# Patient Record
Sex: Male | Born: 1955 | Race: White | Hispanic: No | Marital: Married | Smoking: Light tobacco smoker
Health system: Southern US, Community
[De-identification: ages and names within clinical notes are randomized; demographics above are authoritative.]

## PROBLEM LIST (undated history)

## (undated) DIAGNOSIS — M4800 Spinal stenosis, site unspecified: Secondary | ICD-10-CM

## (undated) HISTORY — DX: Spinal stenosis, site unspecified: M48.00

## (undated) HISTORY — PX: LITHOTRIPSY: SUR834

---

## 2019-04-01 ENCOUNTER — Ambulatory Visit (INDEPENDENT_AMBULATORY_CARE_PROVIDER_SITE_OTHER): Payer: Managed Care, Other (non HMO) | Admitting: Family Medicine

## 2019-04-01 ENCOUNTER — Encounter: Payer: Self-pay | Admitting: Family Medicine

## 2019-04-01 ENCOUNTER — Other Ambulatory Visit: Payer: Self-pay

## 2019-04-01 VITALS — BP 154/98 | HR 70 | Ht 70.0 in | Wt 184.0 lb

## 2019-04-01 DIAGNOSIS — R252 Cramp and spasm: Secondary | ICD-10-CM

## 2019-04-01 DIAGNOSIS — I10 Essential (primary) hypertension: Secondary | ICD-10-CM | POA: Diagnosis not present

## 2019-04-01 DIAGNOSIS — Z13220 Encounter for screening for lipoid disorders: Secondary | ICD-10-CM

## 2019-04-01 NOTE — Patient Instructions (Addendum)
Great to meet you! We'll be in touch with lab results You can try increasing the arginine supplement  See me again 6 months or sooner if needed.    Managing Your Hypertension Hypertension is commonly called high blood pressure. This is when the force of your blood pressing against the walls of your arteries is too strong. Arteries are blood vessels that carry blood from your heart throughout your body. Hypertension forces the heart to work harder to pump blood, and may cause the arteries to become narrow or stiff. Having untreated or uncontrolled hypertension can cause heart attack, stroke, kidney disease, and other problems. What are blood pressure readings? A blood pressure reading consists of a higher number over a lower number. Ideally, your blood pressure should be below 120/80. The first ("top") number is called the systolic pressure. It is a measure of the pressure in your arteries as your heart beats. The second ("bottom") number is called the diastolic pressure. It is a measure of the pressure in your arteries as the heart relaxes. What does my blood pressure reading mean? Blood pressure is classified into four stages. Based on your blood pressure reading, your health care provider may use the following stages to determine what type of treatment you need, if any. Systolic pressure and diastolic pressure are measured in a unit called mm Hg. Normal  Systolic pressure: below 123456.  Diastolic pressure: below 80. Elevated  Systolic pressure: [id number].  Diastolic pressure: below 80. Hypertension stage 1  Systolic pressure: 0000000.  Diastolic pressure: [id number]. Hypertension stage 2  Systolic pressure: [id number] or above.  Diastolic pressure: 90 or above. What health risks are associated with hypertension? Managing your hypertension is an important responsibility. Uncontrolled hypertension can lead to:  A heart attack.  A stroke.  A weakened blood vessel (aneurysm).  Heart  failure.  Kidney damage.  Eye damage.  Metabolic syndrome.  Memory and concentration problems. What changes can I make to manage my hypertension? Hypertension can be managed by making lifestyle changes and possibly by taking medicines. Your health care provider will help you make a plan to bring your blood pressure within a normal range. Eating and drinking   Eat a diet that is high in fiber and potassium, and low in salt (sodium), added sugar, and fat. An example eating plan is called the DASH (Dietary Approaches to Stop Hypertension) diet. To eat this way: ? Eat plenty of fresh fruits and vegetables. Try to fill half of your plate at each meal with fruits and vegetables. ? Eat whole grains, such as whole wheat pasta, brown rice, or whole grain bread. Fill about one quarter of your plate with whole grains. ? Eat low-fat diary products. ? Avoid fatty cuts of meat, processed or cured meats, and poultry with skin. Fill about one quarter of your plate with lean proteins such as fish, chicken without skin, beans, eggs, and tofu. ? Avoid premade and processed foods. These tend to be higher in sodium, added sugar, and fat.  Reduce your daily sodium intake. Most people with hypertension should eat less than 1,500 mg of sodium a day.  Limit alcohol intake to no more than 1 drink a day for nonpregnant women and 2 drinks a day for men. One drink equals 12 oz of beer, 5 oz of wine, or 1 oz of hard liquor. Lifestyle  Work with your health care provider to maintain a healthy body weight, or to lose weight. Ask what an ideal weight is for you.  Get at least 30 minutes of exercise that causes your heart to beat faster (aerobic exercise) most days of the week. Activities may include walking, swimming, or biking.  Include exercise to strengthen your muscles (resistance exercise), such as weight lifting, as part of your weekly exercise routine. Try to do these types of exercises for 30 minutes at least  3 days a week.  Do not use any products that contain nicotine or tobacco, such as cigarettes and e-cigarettes. If you need help quitting, ask your health care provider.  Control any long-term (chronic) conditions you have, such as high cholesterol or diabetes. Monitoring  Monitor your blood pressure at home as told by your health care provider. Your personal target blood pressure may vary depending on your medical conditions, your age, and other factors.  Have your blood pressure checked regularly, as often as told by your health care provider. Working with your health care provider  Review all the medicines you take with your health care provider because there may be side effects or interactions.  Talk with your health care provider about your diet, exercise habits, and other lifestyle factors that may be contributing to hypertension.  Visit your health care provider regularly. Your health care provider can help you create and adjust your plan for managing hypertension. Will I need medicine to control my blood pressure? Your health care provider may prescribe medicine if lifestyle changes are not enough to get your blood pressure under control, and if:  Your systolic blood pressure is 130 or higher.  Your diastolic blood pressure is 80 or higher. Take medicines only as told by your health care provider. Follow the directions carefully. Blood pressure medicines must be taken as prescribed. The medicine does not work as well when you skip doses. Skipping doses also puts you at risk for problems. Contact a health care provider if:  You think you are having a reaction to medicines you have taken.  You have repeated (recurrent) headaches.  You feel dizzy.  You have swelling in your ankles.  You have trouble with your vision. Get help right away if:  You develop a severe headache or confusion.  You have unusual weakness or numbness, or you feel faint.  You have severe pain in your  chest or abdomen.  You vomit repeatedly.  You have trouble breathing. Summary  Hypertension is when the force of blood pumping through your arteries is too strong. If this condition is not controlled, it may put you at risk for serious complications.  Your personal target blood pressure may vary depending on your medical conditions, your age, and other factors. For most people, a normal blood pressure is less than 120/80.  Hypertension is managed by lifestyle changes, medicines, or both. Lifestyle changes include weight loss, eating a healthy, low-sodium diet, exercising more, and limiting alcohol. This information is not intended to replace advice given to you by your health care provider. Make sure you discuss any questions you have with your health care provider. Document Revised: 05/31/2018 Document Reviewed: 01/05/2016 Elsevier Patient Education  [place].

## 2019-04-01 NOTE — Progress Notes (Signed)
Brett Garner - 64 y.o. male MRN [id number]  Date of birth: Dec 05, 1955  Subjective Chief Complaint  Patient presents with  . Establish Care    HPI Brett Garner is a 64 y.o. male with a history of kidney stones and HTN here today for initial visit.  He reports some issues with cramping in his hands. He denies associated numbness or tingling.  He denies chronic neck pain or cramping in other extremities.  He has had some improvement with Mg supplement. He is taking a variety of supplements to help with blood pressure including L-Arginine.  He stays active, walking several minutes per day.  He feels that he follows a healthy diet.   ROS:  A comprehensive ROS was completed and negative except as noted per HPI  Allergies  Allergen Reactions  . Shellfish Allergy     History reviewed. No pertinent past medical history.  Past Surgical History:  Procedure Laterality Date  . LITHOTRIPSY Left     Social History   Socioeconomic History  . Marital status: Married    Spouse name: Not on file  . Number of children: Not on file  . Years of education: Not on file  . Highest education level: Not on file  Occupational History  . Not on file  Tobacco Use  . Smoking status: Light Tobacco Smoker    Types: Cigars  Substance and Sexual Activity  . Alcohol use: Yes  . Drug use: Never  . Sexual activity: Yes    Partners: Female    Birth control/protection: None  Other Topics Concern  . Not on file  Social History Narrative  . Not on file   Social Determinants of Health   Financial Resource Strain:   . Difficulty of Paying Living Expenses: Not on file  Food Insecurity:   . Worried About Charity fundraiser in the Last Year: Not on file  . Ran Out of Food in the Last Year: Not on file  Transportation Needs:   . Lack of Transportation (Medical): Not on file  . Lack of Transportation (Non-Medical): Not on file  Physical Activity:   . Days of Exercise per Week: Not on file  . Minutes of  Exercise per Session: Not on file  Stress:   . Feeling of Stress : Not on file  Social Connections:   . Frequency of Communication with Friends and Family: Not on file  . Frequency of Social Gatherings with Friends and Family: Not on file  . Attends Religious Services: Not on file  . Active Member of Clubs or Organizations: Not on file  . Attends Archivist Meetings: Not on file  . Marital Status: Not on file    Family History  Problem Relation Age of Onset  . Hypertension Mother   . Congestive Heart Failure Mother   . Alcoholism Father     Health Maintenance  Topic Date Due  . Hepatitis C Screening  Nov 18, 1955  . HIV Screening  03/08/1970  . COLONOSCOPY  03/08/2005  . TETANUS/TDAP  02/21/2015  . INFLUENZA VACCINE  Completed    ----------------------------------------------------------------------------------------------------------------------------------------------------------------------------------------------------------------- Physical Exam BP (!) 154/98   Pulse 70   Ht 5\' 10"  (1.778 m)   Wt 184 lb (83.5 kg)   BMI 26.40 kg/m   Physical Exam Constitutional:      Appearance: Normal appearance.  HENT:     Head: Normocephalic and atraumatic.     Mouth/Throat:     Mouth: Mucous membranes are moist.  Eyes:  General: No scleral icterus. Cardiovascular:     Rate and Rhythm: Normal rate and regular rhythm.  Pulmonary:     Effort: Pulmonary effort is normal.     Breath sounds: Normal breath sounds.  Musculoskeletal:     Cervical back: Neck supple.  Skin:    General: Skin is warm and dry.  Neurological:     General: No focal deficit present.     Mental Status: He is alert.     Comments: Negative phalen tests and carpal tunnel compression test.    Psychiatric:        Mood and Affect: Mood normal.        Behavior: Behavior normal.      ------------------------------------------------------------------------------------------------------------------------------------------------------------------------------------------------------------------- Assessment and Plan  Essential hypertension Blood pressure is not at goal at for age and co-morbidities.  He reports BP is typically elevated at doctor and has good BP readings at home.  He declines medications at this time and would prefer to continue on current supplements.  Discussed limited utility in controlling his blood pressure.  In addition they were instructed to follow a low sodium diet with regular exercise to help to maintain adequate control of blood pressure.    Cramping of hands History and exam do not seem consistent with CTS at this time.  Check electrolytes including magnesium.  If worsening or developing new symptoms we can consider nerve conduction.     This visit occurred during the SARS-CoV-2 public health emergency.  Safety protocols were in place, including screening questions prior to the visit, additional usage of staff PPE, and extensive cleaning of exam room while observing appropriate contact time as indicated for disinfecting solutions.

## 2019-04-02 LAB — LIPID PANEL
Cholesterol: 163 mg/dL (ref <200)
HDL: 39 mg/dL — ABNORMAL LOW (ref >=40)
LDL Cholesterol (Calc): 103 mg/dL (calc) — ABNORMAL HIGH
Non-HDL Cholesterol (Calc): 124 mg/dL (calc) (ref <130)
Total CHOL/HDL Ratio: 4.2 (calc) (ref <5.0)
Triglycerides: 116 mg/dL (ref <150)

## 2019-04-02 LAB — CBC
HCT: 40.3 % (ref 38.5–50.0)
Hemoglobin: 13.9 g/dL (ref 13.2–17.1)
MCH: 30.3 pg (ref 27.0–33.0)
MCHC: 34.5 g/dL (ref 32.0–36.0)
MCV: 88 fL (ref 80.0–100.0)
MPV: 10 fL (ref 7.5–12.5)
Platelets: 306 10*3/uL (ref 140–400)
RBC: 4.58 10*6/uL (ref 4.20–5.80)
RDW: 12.5 % (ref 11.0–15.0)
WBC: 5 10*3/uL (ref 3.8–10.8)

## 2019-04-02 LAB — COMPLETE METABOLIC PANEL WITH GFR
AG Ratio: 1.6 (calc) (ref 1.0–2.5)
ALT: 49 U/L — ABNORMAL HIGH (ref 9–46)
AST: 25 U/L (ref 10–35)
Albumin: 4.4 g/dL (ref 3.6–5.1)
Alkaline phosphatase (APISO): 45 U/L (ref 35–144)
BUN: 18 mg/dL (ref 7–25)
CO2: 29 mmol/L (ref 20–32)
Calcium: 9.1 mg/dL (ref 8.6–10.3)
Chloride: 104 mmol/L (ref 98–110)
Creat: 0.82 mg/dL (ref 0.70–1.25)
GFR, Est African American: 108 mL/min/{1.73_m2} (ref >=60)
GFR, Est Non African American: 93 mL/min/{1.73_m2} (ref >=60)
Globulin: 2.7 g/dL (calc) (ref 1.9–3.7)
Glucose, Bld: 95 mg/dL (ref 65–99)
Potassium: 5.3 mmol/L (ref 3.5–5.3)
Sodium: 140 mmol/L (ref 135–146)
Total Bilirubin: 0.8 mg/dL (ref 0.2–1.2)
Total Protein: 7.1 g/dL (ref 6.1–8.1)

## 2019-04-02 LAB — MAGNESIUM: Magnesium: 2.2 mg/dL (ref 1.5–2.5)

## 2019-04-02 LAB — TSH: TSH: 1.22 mIU/L (ref 0.40–4.50)

## 2019-04-06 ENCOUNTER — Encounter: Payer: Self-pay | Admitting: Family Medicine

## 2019-04-06 DIAGNOSIS — R252 Cramp and spasm: Secondary | ICD-10-CM | POA: Insufficient documentation

## 2019-04-06 DIAGNOSIS — I10 Essential (primary) hypertension: Secondary | ICD-10-CM | POA: Insufficient documentation

## 2019-04-06 NOTE — Assessment & Plan Note (Signed)
History and exam do not seem consistent with CTS at this time.  Check electrolytes including magnesium.  If worsening or developing new symptoms we can consider nerve conduction.

## 2019-04-06 NOTE — Assessment & Plan Note (Signed)
Blood pressure is not at goal at for age and co-morbidities.  He reports BP is typically elevated at doctor and has good BP readings at home.  He declines medications at this time and would prefer to continue on current supplements.  Discussed limited utility in controlling his blood pressure.  In addition they were instructed to follow a low sodium diet with regular exercise to help to maintain adequate control of blood pressure.

## 2019-05-01 ENCOUNTER — Telehealth: Payer: Self-pay

## 2019-05-01 DIAGNOSIS — Z8616 Personal history of COVID-19: Secondary | ICD-10-CM

## 2019-05-01 NOTE — Telephone Encounter (Signed)
Eliu called and left a message requesting a COVID-19 antibodies test. Please advise.

## 2019-05-01 NOTE — Telephone Encounter (Signed)
We can check antibodies but doesn't necessarily mean that there are sufficient antibodies to provide immunity, just whether he has been exposed to coronavirus previously.

## 2019-05-02 NOTE — Telephone Encounter (Signed)
Left message advising of recommendations and a return call.

## 2019-05-02 NOTE — Telephone Encounter (Signed)
I advised of recommendations. I also mentioned his insurance may choose to not pay for the service. He said "I am not concerned about that, I have enough cash". Order placed. Patient is aware and will go next week.

## 2019-05-02 NOTE — Telephone Encounter (Signed)
Thanks!  CM

## 2019-05-06 LAB — SAR COV2 SEROLOGY (COVID19)AB(IGG),IA: SARS CoV2 AB IGG: POSITIVE — AB

## 2019-10-06 ENCOUNTER — Encounter: Payer: Self-pay | Admitting: Family Medicine

## 2019-10-06 ENCOUNTER — Ambulatory Visit (INDEPENDENT_AMBULATORY_CARE_PROVIDER_SITE_OTHER): Payer: Managed Care, Other (non HMO) | Admitting: Family Medicine

## 2019-10-06 VITALS — BP 151/94 | HR 69 | Temp 98.0°F | Wt 175.1 lb

## 2019-10-06 DIAGNOSIS — U071 COVID-19: Secondary | ICD-10-CM | POA: Diagnosis not present

## 2019-10-06 DIAGNOSIS — I10 Essential (primary) hypertension: Secondary | ICD-10-CM

## 2019-10-06 MED ORDER — LOSARTAN POTASSIUM 25 MG PO TABS: 25.0000 mg | ORAL_TABLET | Freq: Every day | ORAL | 2 refills | Status: DC

## 2019-10-06 NOTE — Patient Instructions (Signed)
Great to see you today! Start losartan 25mg  daily See me again around the first of the year.

## 2019-10-06 NOTE — Assessment & Plan Note (Signed)
Previous COVID infection. Wishes to have antibodies rechecked.  Discussed that + antibody test does not mean he is immune and vaccination is still recommended.

## 2019-10-06 NOTE — Progress Notes (Signed)
Brett Garner - 64 y.o. male MRN [id number]  Date of birth: [birth year]  Subjective Chief Complaint  Patient presents with  . Hypertension    HPI Brett Garner is a 64 y.o. male with history of HTN here today for follow up visit.   He continues taking several supplements including magnesium and arginine to help with his BP.  He does admit to some increased stress at home.  He is willing to start medication at this point as he realizes he BP continues to be elevated.  He denies symptoms related to elevated BP including chest pain, shortness of breath, palpitations, headache or vision changes.    He is also insistent on having COVID antibody retest to see if this remains+.  He has not had vaccine.  ROS:  A comprehensive ROS was completed and negative except as noted per HPI  Allergies  Allergen Reactions  . Shellfish Allergy     No past medical history on file.  Past Surgical History:  Procedure Laterality Date  . LITHOTRIPSY Left     Social History   Socioeconomic History  . Marital status: Married    Spouse name: Not on file  . Number of children: Not on file  . Years of education: Not on file  . Highest education level: Not on file  Occupational History  . Not on file  Tobacco Use  . Smoking status: Light Tobacco Smoker    Types: Cigars  Vaping Use  . Vaping Use: Never used  Substance and Sexual Activity  . Alcohol use: Yes  . Drug use: Never  . Sexual activity: Yes    Partners: Female    Birth control/protection: None  Other Topics Concern  . Not on file  Social History Narrative  . Not on file   Social Determinants of Health   Financial Resource Strain:   . Difficulty of Paying Living Expenses:   Food Insecurity:   . Worried About Charity fundraiser in the Last Year:   . Arboriculturist in the Last Year:   Transportation Needs:   . Film/video editor (Medical):   Marland Kitchen Lack of Transportation (Non-Medical):   Physical Activity:   . Days of Exercise  per Week:   . Minutes of Exercise per Session:   Stress:   . Feeling of Stress :   Social Connections:   . Frequency of Communication with Friends and Family:   . Frequency of Social Gatherings with Friends and Family:   . Attends Religious Services:   . Active Member of Clubs or Organizations:   . Attends Archivist Meetings:   Marland Kitchen Marital Status:     Family History  Problem Relation Age of Onset  . Hypertension Mother   . Congestive Heart Failure Mother   . Alcoholism Father     Health Maintenance  Topic Date Due  . Hepatitis C Screening  Never done  . COVID-19 Vaccine (1) Never done  . HIV Screening  Never done  . COLONOSCOPY  Never done  . TETANUS/TDAP  02/21/2015  . INFLUENZA VACCINE  09/21/2019     ----------------------------------------------------------------------------------------------------------------------------------------------------------------------------------------------------------------- Physical Exam BP (!) 151/94 (BP Location: Left Arm, Patient Position: Sitting, Cuff Size: Normal)   Pulse 69   Temp 98 F (36.7 C) (Oral)   Wt 175 lb 1.3 oz (79.4 kg)   BMI 25.12 kg/m   Physical Exam Constitutional:      Appearance: Normal appearance.  HENT:     Head: Normocephalic and  atraumatic.  Eyes:     General: No scleral icterus. Cardiovascular:     Rate and Rhythm: Normal rate and regular rhythm.  Pulmonary:     Effort: Pulmonary effort is normal.     Breath sounds: Normal breath sounds.  Musculoskeletal:     Cervical back: Neck supple.  Neurological:     General: No focal deficit present.     Mental Status: He is alert.  Psychiatric:        Mood and Affect: Mood normal.        Behavior: Behavior normal.     ------------------------------------------------------------------------------------------------------------------------------------------------------------------------------------------------------------------- Assessment and  Plan  Essential hypertension BP remains elevated.  He did not tolerate atenolol or lisinopril previously.  He did ok with losartan and I will restart this at 25mg .  Encouraged low sodium diet and regular exercise.  Return in about 5 months (around 03/09/2020).    COVID-19 virus detected Previous COVID infection. Wishes to have antibodies rechecked.  Discussed that + antibody test does not mean he is immune and vaccination is still recommended.     Meds ordered this encounter  Medications  . losartan (COZAAR) 25 MG tablet    Sig: Take 1 tablet (25 mg total) by mouth daily.    Dispense:  90 tablet    Refill:  2    Return in about 5 months (around 03/09/2020).    This visit occurred during the SARS-CoV-2 public health emergency.  Safety protocols were in place, including screening questions prior to the visit, additional usage of staff PPE, and extensive cleaning of exam room while observing appropriate contact time as indicated for disinfecting solutions.

## 2019-10-06 NOTE — Assessment & Plan Note (Signed)
BP remains elevated.  He did not tolerate atenolol or lisinopril previously.  He did ok with losartan and I will restart this at 25mg .  Encouraged low sodium diet and regular exercise.  Return in about 5 months (around 03/09/2020).

## 2019-10-07 LAB — SARS-COV-2 ANTIBODY(IGG)SPIKE,SEMI-QUANTITATIVE: SARS COV1 AB(IGG)SPIKE,SEMI QN: 1.6 index — ABNORMAL HIGH (ref <1.00)

## 2020-03-09 ENCOUNTER — Ambulatory Visit: Payer: Managed Care, Other (non HMO) | Admitting: Family Medicine

## 2020-03-16 ENCOUNTER — Other Ambulatory Visit: Payer: Self-pay

## 2020-03-16 ENCOUNTER — Ambulatory Visit (INDEPENDENT_AMBULATORY_CARE_PROVIDER_SITE_OTHER): Payer: Medicare Other | Admitting: Family Medicine

## 2020-03-16 ENCOUNTER — Encounter: Payer: Self-pay | Admitting: Family Medicine

## 2020-03-16 VITALS — BP 133/81 | HR 68 | Wt 172.0 lb

## 2020-03-16 DIAGNOSIS — I10 Essential (primary) hypertension: Secondary | ICD-10-CM

## 2020-03-16 DIAGNOSIS — Z8616 Personal history of COVID-19: Secondary | ICD-10-CM | POA: Diagnosis not present

## 2020-03-16 DIAGNOSIS — Z13220 Encounter for screening for lipoid disorders: Secondary | ICD-10-CM | POA: Diagnosis not present

## 2020-03-16 MED ORDER — LOSARTAN POTASSIUM 25 MG PO TABS: 25.0000 mg | ORAL_TABLET | Freq: Every day | ORAL | 2 refills | Status: DC

## 2020-03-16 NOTE — Assessment & Plan Note (Signed)
Blood pressure is at goal at for age and co-morbidities.  I recommend continuation of losartan at current strength.  In addition they were instructed to follow a low sodium diet with regular exercise to help to maintain adequate control of blood pressure. Updated labs orderd.

## 2020-03-16 NOTE — Progress Notes (Signed)
Brett Garner - 66 y.o. male MRN [id number]  Date of birth: 1956/02/02  Subjective Chief Complaint  Patient presents with  . Hypertension    HPI Brett Garner is a 65 y.o. male here today for follow up of HTN.  Current management with losartan, doing well with this.  Tries to stay active and maintain a healthy weight.    He would also like to have COVID antibodies re-checked.  Due for colonoscopy, would like to discuss this further at next visit.    He has had flu vaccine, he will send information about this.   ROS:  A comprehensive ROS was completed and negative except as noted per HPI  Allergies  Allergen Reactions  . Shellfish Allergy     No past medical history on file.  Past Surgical History:  Procedure Laterality Date  . LITHOTRIPSY Left     Social History   Socioeconomic History  . Marital status: Married    Spouse name: Not on file  . Number of children: Not on file  . Years of education: Not on file  . Highest education level: Not on file  Occupational History  . Not on file  Tobacco Use  . Smoking status: Light Tobacco Smoker    Types: Cigars  . Smokeless tobacco: Never Used  Vaping Use  . Vaping Use: Never used  Substance and Sexual Activity  . Alcohol use: Yes  . Drug use: Never  . Sexual activity: Yes    Partners: Female    Birth control/protection: None  Other Topics Concern  . Not on file  Social History Narrative  . Not on file   Social Determinants of Health   Financial Resource Strain: Not on file  Food Insecurity: Not on file  Transportation Needs: Not on file  Physical Activity: Not on file  Stress: Not on file  Social Connections: Not on file    Family History  Problem Relation Age of Onset  . Hypertension Mother   . Congestive Heart Failure Mother   . Alcoholism Father     Health Maintenance  Topic Date Due  . Hepatitis C Screening  Never done  . COVID-19 Vaccine (1) Never done  . HIV Screening  Never done  .  COLONOSCOPY (Pts 45-36yrs Insurance coverage will need to be confirmed)  Never done  . TETANUS/TDAP  02/21/2015  . INFLUENZA VACCINE  09/21/2019  . PNA vac Low Risk Adult (1 of 2 - PCV13) 03/08/2020     ----------------------------------------------------------------------------------------------------------------------------------------------------------------------------------------------------------------- Physical Exam BP 133/81   Pulse 68   Wt 172 lb (78 kg)   SpO2 100%   BMI 24.68 kg/m   Physical Exam Constitutional:      Appearance: Normal appearance.  HENT:     Head: Normocephalic and atraumatic.  Eyes:     General: No scleral icterus. Cardiovascular:     Rate and Rhythm: Normal rate and regular rhythm.  Pulmonary:     Effort: Pulmonary effort is normal.     Breath sounds: Normal breath sounds.  Musculoskeletal:     Cervical back: Neck supple.  Neurological:     General: No focal deficit present.     Mental Status: He is alert.  Psychiatric:        Mood and Affect: Mood normal.        Behavior: Behavior normal.     ------------------------------------------------------------------------------------------------------------------------------------------------------------------------------------------------------------------- Assessment and Plan  History of COVID-19 He would like to check to see if he continues to have detectable antibodies, spike IGG  antibody test ordered.   Essential hypertension Blood pressure is at goal at for age and co-morbidities.  I recommend continuation of losartan at current strength.  In addition they were instructed to follow a low sodium diet with regular exercise to help to maintain adequate control of blood pressure. Updated labs orderd.      Meds ordered this encounter  Medications  . losartan (COZAAR) 25 MG tablet    Sig: Take 1 tablet (25 mg total) by mouth daily.    Dispense:  90 tablet    Refill:  2    Return in  about 6 months (around 09/13/2020) for HTN.    This visit occurred during the SARS-CoV-2 public health emergency.  Safety protocols were in place, including screening questions prior to the visit, additional usage of staff PPE, and extensive cleaning of exam room while observing appropriate contact time as indicated for disinfecting solutions.

## 2020-03-16 NOTE — Assessment & Plan Note (Signed)
He would like to check to see if he continues to have detectable antibodies, spike IGG antibody test ordered.

## 2020-03-16 NOTE — Patient Instructions (Signed)
Great to see you today! Continue losartan Have labs completed.  See me again in 6 months.

## 2020-03-17 LAB — COMPLETE METABOLIC PANEL WITH GFR
AG Ratio: 2.2 (calc) (ref 1.0–2.5)
ALT: 40 U/L (ref 9–46)
AST: 23 U/L (ref 10–35)
Albumin: 5.2 g/dL — ABNORMAL HIGH (ref 3.6–5.1)
Alkaline phosphatase (APISO): 54 U/L (ref 35–144)
BUN: 19 mg/dL (ref 7–25)
CO2: 28 mmol/L (ref 20–32)
Calcium: 9.7 mg/dL (ref 8.6–10.3)
Chloride: 103 mmol/L (ref 98–110)
Creat: 0.83 mg/dL (ref 0.70–1.25)
GFR, Est African American: 107 mL/min/{1.73_m2} (ref >=60)
GFR, Est Non African American: 92 mL/min/{1.73_m2} (ref >=60)
Globulin: 2.4 g/dL (calc) (ref 1.9–3.7)
Glucose, Bld: 91 mg/dL (ref 65–99)
Potassium: 4.6 mmol/L (ref 3.5–5.3)
Sodium: 140 mmol/L (ref 135–146)
Total Bilirubin: 0.7 mg/dL (ref 0.2–1.2)
Total Protein: 7.6 g/dL (ref 6.1–8.1)

## 2020-03-17 LAB — CBC
HCT: 46.1 % (ref 38.5–50.0)
Hemoglobin: 15.9 g/dL (ref 13.2–17.1)
MCH: 30.9 pg (ref 27.0–33.0)
MCHC: 34.5 g/dL (ref 32.0–36.0)
MCV: 89.5 fL (ref 80.0–100.0)
MPV: 10.5 fL (ref 7.5–12.5)
Platelets: 193 10*3/uL (ref 140–400)
RBC: 5.15 10*6/uL (ref 4.20–5.80)
RDW: 12.5 % (ref 11.0–15.0)
WBC: 3.9 10*3/uL (ref 3.8–10.8)

## 2020-03-17 LAB — LIPID PANEL
Cholesterol: 177 mg/dL (ref <200)
HDL: 56 mg/dL (ref >=40)
LDL Cholesterol (Calc): 105 mg/dL (calc) — ABNORMAL HIGH
Non-HDL Cholesterol (Calc): 121 mg/dL (calc) (ref <130)
Total CHOL/HDL Ratio: 3.2 (calc) (ref <5.0)
Triglycerides: 72 mg/dL (ref <150)

## 2020-03-17 LAB — SARS-COV-2 ANTIBODY(IGG)SPIKE,SEMI-QUANTITATIVE: SARS COV1 AB(IGG)SPIKE,SEMI QN: 1.51 index — ABNORMAL HIGH (ref <1.00)

## 2020-05-24 ENCOUNTER — Other Ambulatory Visit: Payer: Self-pay

## 2020-05-24 ENCOUNTER — Encounter: Payer: Self-pay | Admitting: Family Medicine

## 2020-05-24 ENCOUNTER — Ambulatory Visit (INDEPENDENT_AMBULATORY_CARE_PROVIDER_SITE_OTHER): Payer: Medicare Other | Admitting: Family Medicine

## 2020-05-24 DIAGNOSIS — D485 Neoplasm of uncertain behavior of skin: Secondary | ICD-10-CM | POA: Diagnosis not present

## 2020-05-24 NOTE — Progress Notes (Signed)
Brett Garner - 65 y.o. male MRN [id number]  Date of birth: December 28, 1955  Subjective No chief complaint on file.   HPI Brett Garner is a 65 y.o. male here today with concern of skin growth.  He noted a skin growth adjacent to his right eye a few weeks ago.  Area is rough in texture feels tight when he opens his eye completely.  He denies any bleeding or drainage in this area.  He requests a referral to dermatology given location of area.  He denies any prior history of skin cancer removal.  ROS:  A comprehensive ROS was completed and negative except as noted per HPI  Allergies  Allergen Reactions  . Shellfish Allergy     No past medical history on file.  Past Surgical History:  Procedure Laterality Date  . LITHOTRIPSY Left     Social History   Socioeconomic History  . Marital status: Married    Spouse name: Not on file  . Number of children: Not on file  . Years of education: Not on file  . Highest education level: Not on file  Occupational History  . Not on file  Tobacco Use  . Smoking status: Light Tobacco Smoker    Types: Cigars  . Smokeless tobacco: Never Used  Vaping Use  . Vaping Use: Never used  Substance and Sexual Activity  . Alcohol use: Yes  . Drug use: Never  . Sexual activity: Yes    Partners: Female    Birth control/protection: None  Other Topics Concern  . Not on file  Social History Narrative  . Not on file   Social Determinants of Health   Financial Resource Strain: Not on file  Food Insecurity: Not on file  Transportation Needs: Not on file  Physical Activity: Not on file  Stress: Not on file  Social Connections: Not on file    Family History  Problem Relation Age of Onset  . Hypertension Mother   . Congestive Heart Failure Mother   . Alcoholism Father     Health Maintenance  Topic Date Due  . Hepatitis C Screening  Never done  . COVID-19 Vaccine (1) Never done  . HIV Screening  Never done  . COLONOSCOPY (Pts 45-77yrs Insurance  coverage will need to be confirmed)  Never done  . TETANUS/TDAP  02/21/2015  . PNA vac Low Risk Adult (1 of 2 - PCV13) Never done  . INFLUENZA VACCINE  09/20/2020  . HPV VACCINES  Aged Out     ----------------------------------------------------------------------------------------------------------------------------------------------------------------------------------------------------------------- Physical Exam There were no vitals taken for this visit.  Physical Exam Constitutional:      Appearance: Normal appearance.  HENT:     Head: Normocephalic and atraumatic.  Skin:    Comments: Approximately 3 to 4 mm lesion between the right nasal bridge and medial canthus.  Slightly rough texture.  Neurological:     General: No focal deficit present.     Mental Status: He is alert.  Psychiatric:        Mood and Affect: Mood normal.        Behavior: Behavior normal.     ------------------------------------------------------------------------------------------------------------------------------------------------------------------------------------------------------------------- Assessment and Plan  Neoplasm of uncertain behavior of skin of eyebrow Possible BCC Requesting referral to dermatology, orders entered.    No orders of the defined types were placed in this encounter.   No follow-ups on file.    This visit occurred during the SARS-CoV-2 public health emergency.  Safety protocols were in place, including screening questions prior to  the visit, additional usage of staff PPE, and extensive cleaning of exam room while observing appropriate contact time as indicated for disinfecting solutions.

## 2020-05-24 NOTE — Assessment & Plan Note (Signed)
Possible BCC Requesting referral to dermatology, orders entered.

## 2020-05-27 ENCOUNTER — Encounter: Payer: Self-pay | Admitting: Family Medicine

## 2020-05-28 NOTE — Telephone Encounter (Signed)
I sent referral to [facility] they will call and schedule with patient - CF

## 2020-09-13 ENCOUNTER — Ambulatory Visit (INDEPENDENT_AMBULATORY_CARE_PROVIDER_SITE_OTHER): Payer: Medicare Other

## 2020-09-13 ENCOUNTER — Encounter: Payer: Self-pay | Admitting: Family Medicine

## 2020-09-13 ENCOUNTER — Other Ambulatory Visit: Payer: Self-pay

## 2020-09-13 ENCOUNTER — Ambulatory Visit (INDEPENDENT_AMBULATORY_CARE_PROVIDER_SITE_OTHER): Payer: Medicare Other | Admitting: Family Medicine

## 2020-09-13 VITALS — BP 138/74 | HR 67 | Temp 98.0°F | Ht 70.0 in | Wt 173.0 lb

## 2020-09-13 DIAGNOSIS — I10 Essential (primary) hypertension: Secondary | ICD-10-CM

## 2020-09-13 DIAGNOSIS — G8929 Other chronic pain: Secondary | ICD-10-CM

## 2020-09-13 DIAGNOSIS — M25531 Pain in right wrist: Secondary | ICD-10-CM | POA: Diagnosis not present

## 2020-09-13 DIAGNOSIS — S56911A Strain of unspecified muscles, fascia and tendons at forearm level, right arm, initial encounter: Secondary | ICD-10-CM | POA: Insufficient documentation

## 2020-09-13 IMAGING — DX DG WRIST COMPLETE 3+V*R*
4 series · 4 of 4 positions shown · non-contrast
Comparison: Non

CLINICAL DATA: Chronic RIGHT wrist pain. Pain on ulnar side of
wrist. History of prior fracture potentially by report.

EXAM:
RIGHT WRIST - COMPLETE 3+ VIEW

[wrist pa]
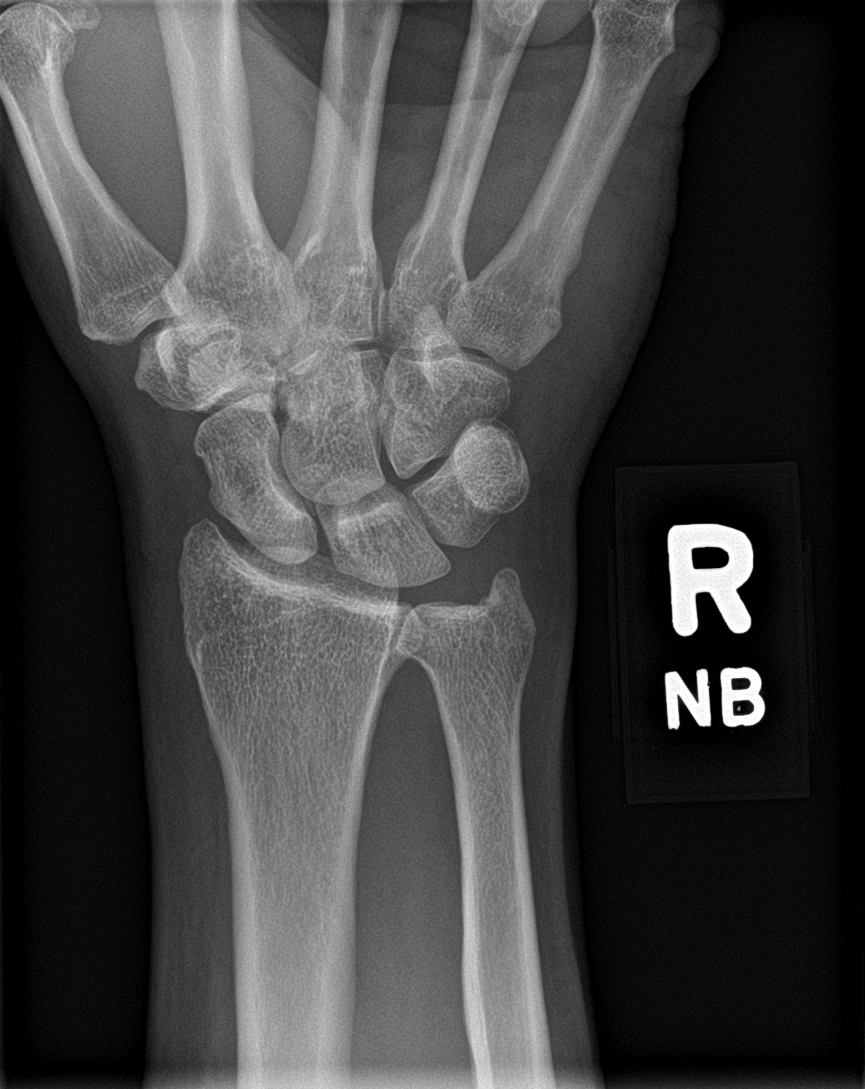

[wrist obl]
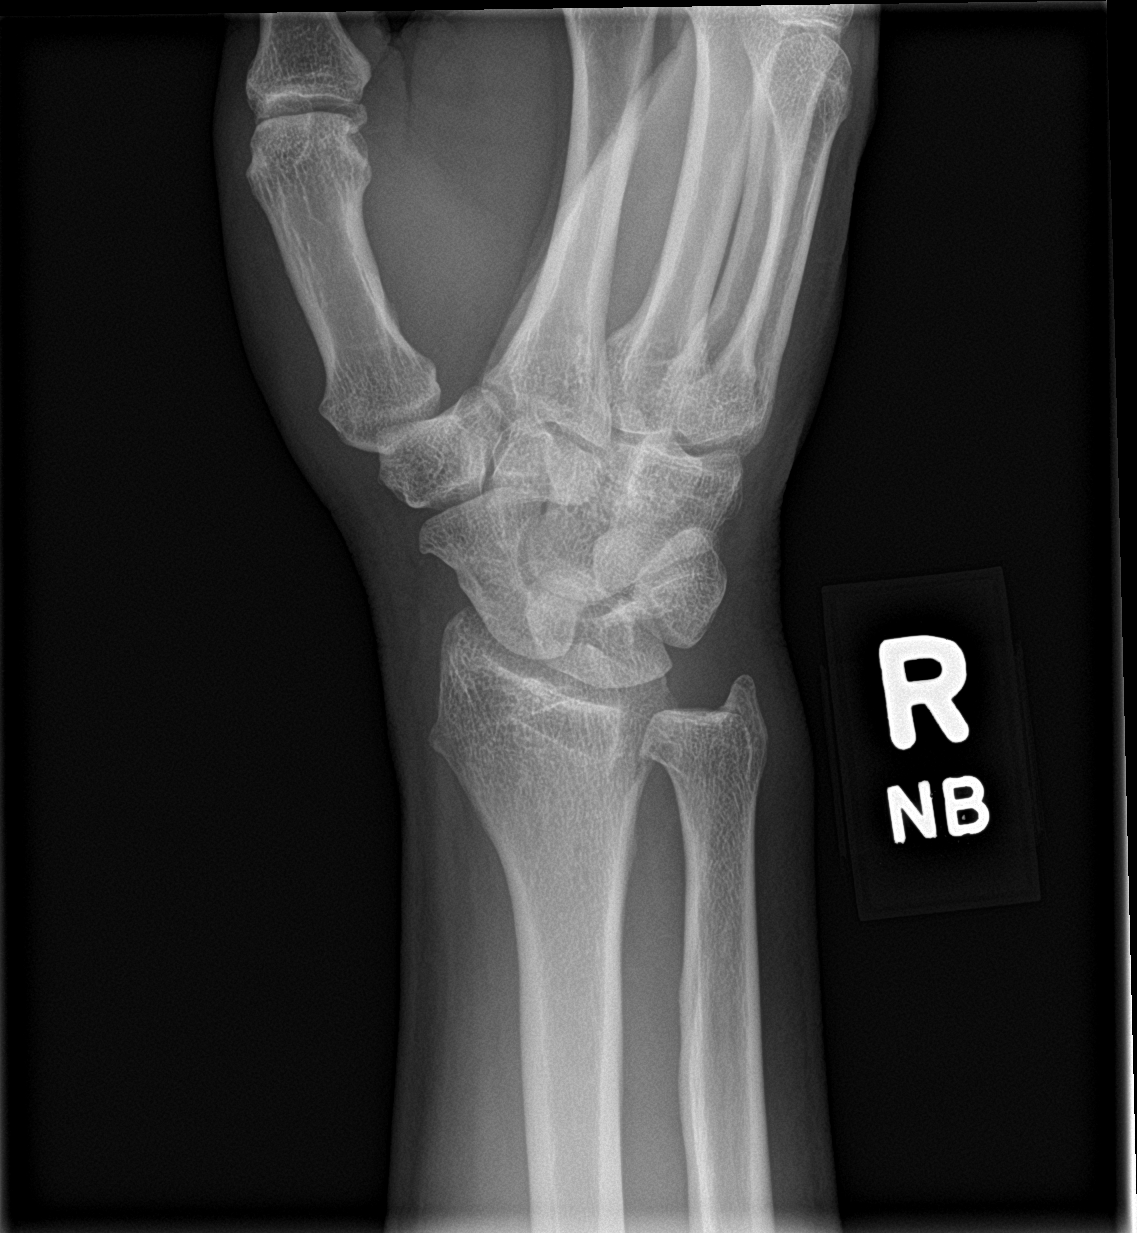

[wrist lat]
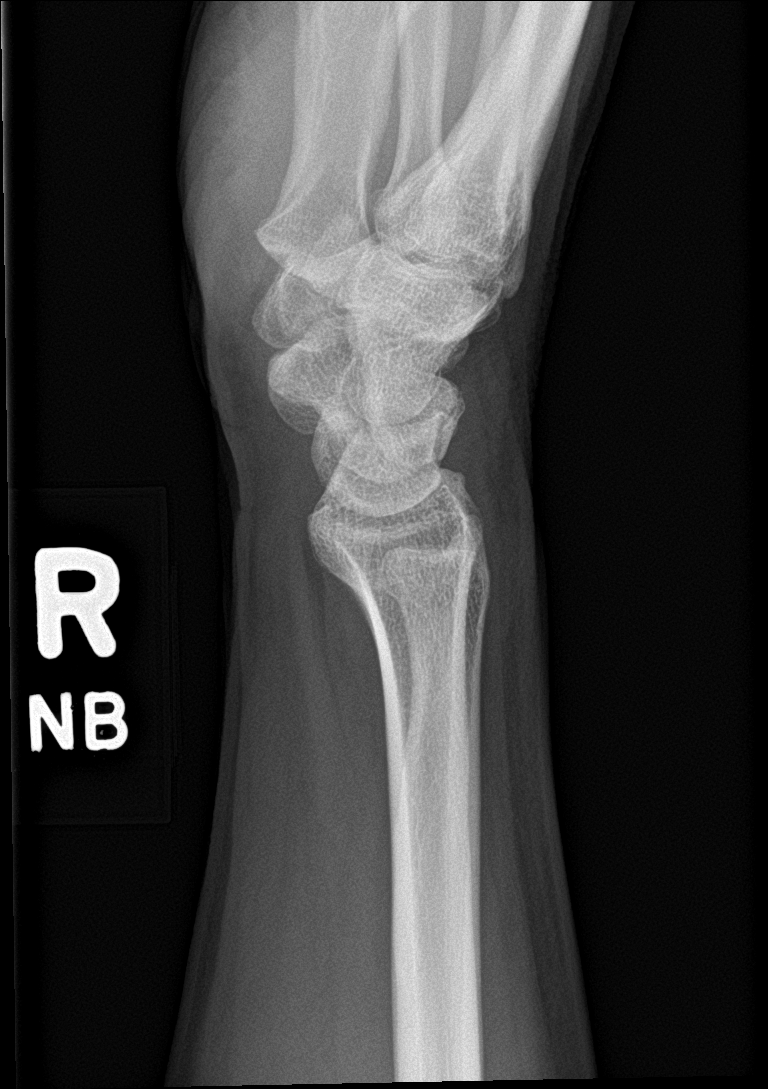

[wrist navicular]
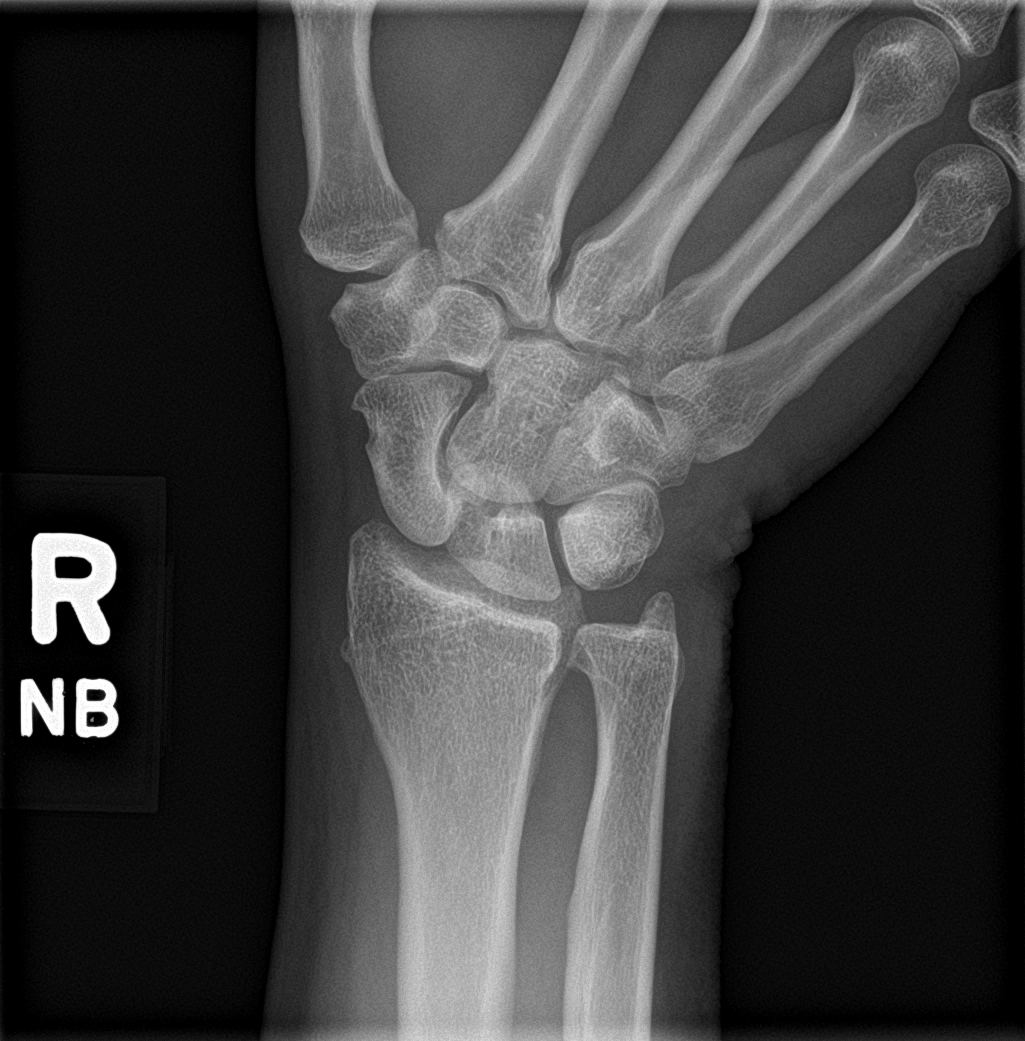

[4 of 4 positions shown; findings below may reference images not displayed]

FINDINGS: There is no evidence of fracture or dislocation. There is no
evidence of arthropathy or other focal bone abnormality. Soft
tissues are unremarkable.
IMPRESSION: Negative evaluation of the RIGHT wrist.

## 2020-09-13 MED ORDER — LOSARTAN POTASSIUM 25 MG PO TABS: 25.0000 mg | ORAL_TABLET | Freq: Every day | ORAL | 2 refills | Status: DC

## 2020-09-13 NOTE — Assessment & Plan Note (Signed)
X-rays of right wrist ordered today.

## 2020-09-13 NOTE — Patient Instructions (Signed)
Try voltaren (diclofenac) gel to Right arm.  We'll be in touch with xray results.   If pain is not improving let's have you follow up with Dr. Dianah Field.  Let's plan to follow up in 6 months.

## 2020-09-13 NOTE — Assessment & Plan Note (Signed)
Recommend continue icing, gentle stretches and trial of diclofenac.  Discussed that if not improving I would recommend having him follow-up with Dr. Dianah Field.

## 2020-09-13 NOTE — Progress Notes (Signed)
Brett Garner - 65 y.o. male MRN [id number]  Date of birth: [birth year]  Subjective Chief Complaint  Patient presents with   Hypertension    HPI Brett Garner is a 65 year old male here today for follow-up visit.  He reports that he is doing pretty well at this time.  He does have concerns of continued right wrist pain as well as some right forearm pain.  He has had right wrist pain for a few months.  He denies any numbness or tingling associated with this.  No weakness of the hand.  He has tried anti-inflammatories as well as some muscle rubs without significant improvement.  He also has some right forearm pain that started a couple weeks ago after lifting some heavy bags of mulch.  Pain is located along the right lateral forearm.  He has been doing some icing this area.  He continues to take losartan for management of hypertension.  He does stay pretty active and follows a low-sodium diet.  He has not had any side effects from medication.  He denies chest pain, shortness of breath, palpitations, headache or vision changes.  ROS:  A comprehensive ROS was completed and negative except as noted per HPI  Allergies  Allergen Reactions   Shellfish Allergy     No past medical history on file.  Past Surgical History:  Procedure Laterality Date   LITHOTRIPSY Left     Social History   Socioeconomic History   Marital status: Married    Spouse name: Not on file   Number of children: Not on file   Years of education: Not on file   Highest education level: Not on file  Occupational History   Not on file  Tobacco Use   Smoking status: Light Smoker    Types: Cigars   Smokeless tobacco: Never  Vaping Use   Vaping Use: Never used  Substance and Sexual Activity   Alcohol use: Yes   Drug use: Never   Sexual activity: Yes    Partners: Female    Birth control/protection: None  Other Topics Concern   Not on file  Social History Narrative   Not on file   Social Determinants of Health    Financial Resource Strain: Not on file  Food Insecurity: Not on file  Transportation Needs: Not on file  Physical Activity: Not on file  Stress: Not on file  Social Connections: Not on file    Family History  Problem Relation Age of Onset   Hypertension Mother    Congestive Heart Failure Mother    Alcoholism Father     Health Maintenance  Topic Date Due   COVID-19 Vaccine (1) Never done   HIV Screening  Never done   Hepatitis C Screening  Never done   Zoster Vaccines- Shingrix (1 of 2) Never done   COLONOSCOPY (Pts 45-30yr Insurance coverage will need to be confirmed)  Never done   TETANUS/TDAP  02/21/2015   PNA vac Low Risk Adult (1 of 2 - PCV13) Never done   INFLUENZA VACCINE  09/20/2020   HPV VACCINES  Aged Out     ----------------------------------------------------------------------------------------------------------------------------------------------------------------------------------------------------------------- Physical Exam BP 138/74 (BP Location: Left Arm, Patient Position: Sitting, Cuff Size: Normal)   Pulse 67   Temp 98 F (36.7 C)   Ht '5\' 10"'$  (1.778 m)   Wt 173 lb (78.5 kg)   SpO2 100%   BMI 24.82 kg/m   Physical Exam Constitutional:      Appearance: Normal appearance.  HENT:  Head: Normocephalic and atraumatic.  Eyes:     General: No scleral icterus. Cardiovascular:     Rate and Rhythm: Normal rate and regular rhythm.  Pulmonary:     Effort: Pulmonary effort is normal.     Breath sounds: Normal breath sounds.  Musculoskeletal:     Cervical back: Neck supple.     Comments: Range of motion of right wrist is normal.  Negative Tinel sign at the carpal tunnel.  No significant weakness noted.  Right forearm with mild tenderness to palpation over the lateral epicondyle.  There is some mild swelling in this area compared to the left.  Neurological:     General: No focal deficit present.     Mental Status: He is alert.     ------------------------------------------------------------------------------------------------------------------------------------------------------------------------------------------------------------------- Assessment and Plan  Forearm strain, right, initial encounter Recommend continue icing, gentle stretches and trial of diclofenac.  Discussed that if not improving I would recommend having him follow-up with Dr. Dianah Field.  Chronic pain of right wrist X-rays of right wrist ordered today.  Essential hypertension His blood pressure remains fairly well controlled at this time.  I recommend continuation of losartan at current strength.   Meds ordered this encounter  Medications   losartan (COZAAR) 25 MG tablet    Sig: Take 1 tablet (25 mg total) by mouth daily.    Dispense:  90 tablet    Refill:  2    Return in about 6 months (around 03/16/2021) for HTN.    This visit occurred during the SARS-CoV-2 public health emergency.  Safety protocols were in place, including screening questions prior to the visit, additional usage of staff PPE, and extensive cleaning of exam room while observing appropriate contact time as indicated for disinfecting solutions.

## 2020-09-13 NOTE — Assessment & Plan Note (Addendum)
His blood pressure remains fairly well controlled at this time.  I recommend continuation of losartan at current strength.  I encouraged him to continue active lifestyle and low-sodium diet.

## 2020-10-18 ENCOUNTER — Ambulatory Visit (INDEPENDENT_AMBULATORY_CARE_PROVIDER_SITE_OTHER): Payer: Medicare Other

## 2020-10-18 ENCOUNTER — Other Ambulatory Visit: Payer: Self-pay

## 2020-10-18 ENCOUNTER — Ambulatory Visit (INDEPENDENT_AMBULATORY_CARE_PROVIDER_SITE_OTHER): Payer: Medicare Other | Admitting: Sports Medicine

## 2020-10-18 DIAGNOSIS — M25531 Pain in right wrist: Secondary | ICD-10-CM

## 2020-10-18 DIAGNOSIS — M542 Cervicalgia: Secondary | ICD-10-CM

## 2020-10-18 DIAGNOSIS — G8929 Other chronic pain: Secondary | ICD-10-CM

## 2020-10-18 DIAGNOSIS — M5030 Other cervical disc degeneration, unspecified cervical region: Secondary | ICD-10-CM

## 2020-10-18 IMAGING — DX DG CERVICAL SPINE COMPLETE 4+V
6 series · 6 of 6 positions shown · non-contrast
Comparison: None.

CLINICAL DATA: Pain in neck.

EXAM:
CERVICAL SPINE - COMPLETE 4+ VIEW

[c-spine lat]
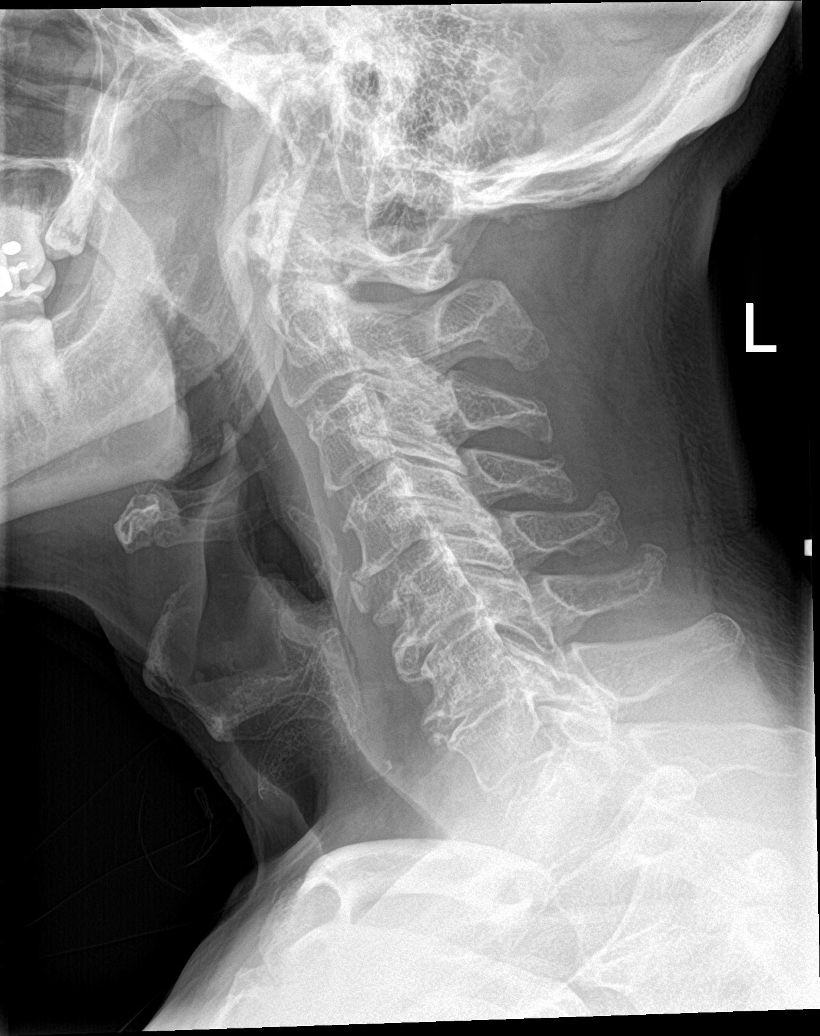

[c-spine obl (1 of 2)]
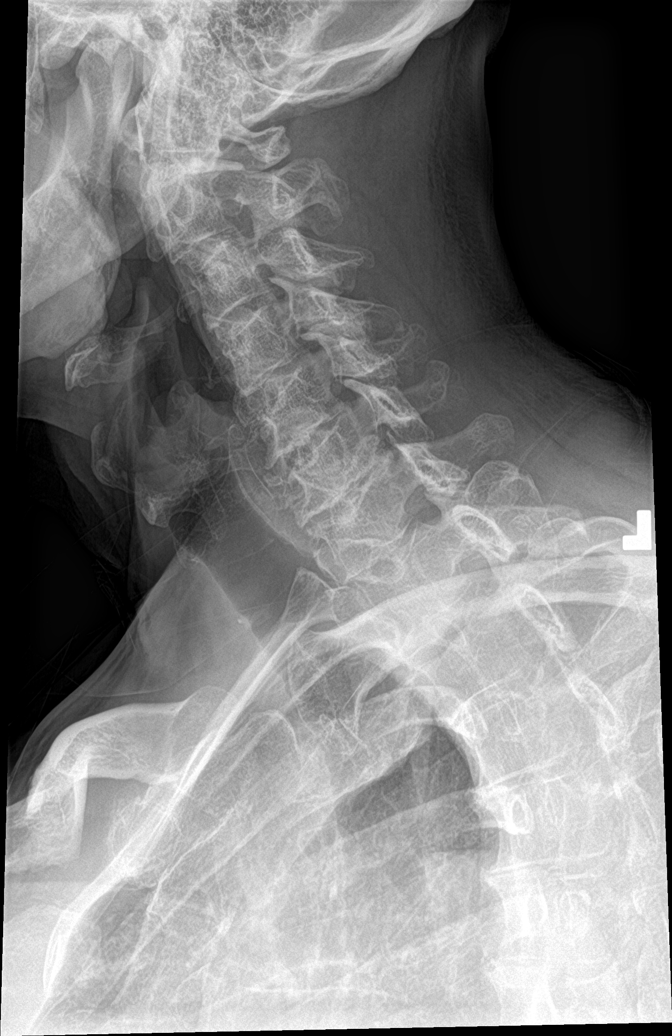

[c-spine obl (2 of 2)]
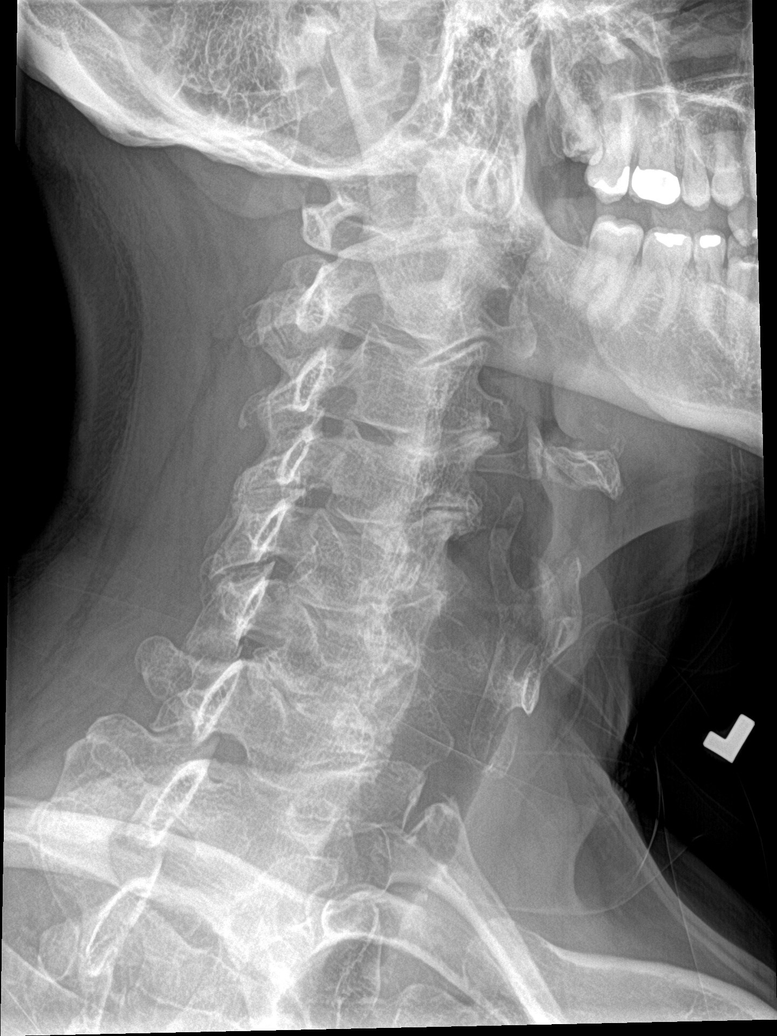

[c-spine ap]
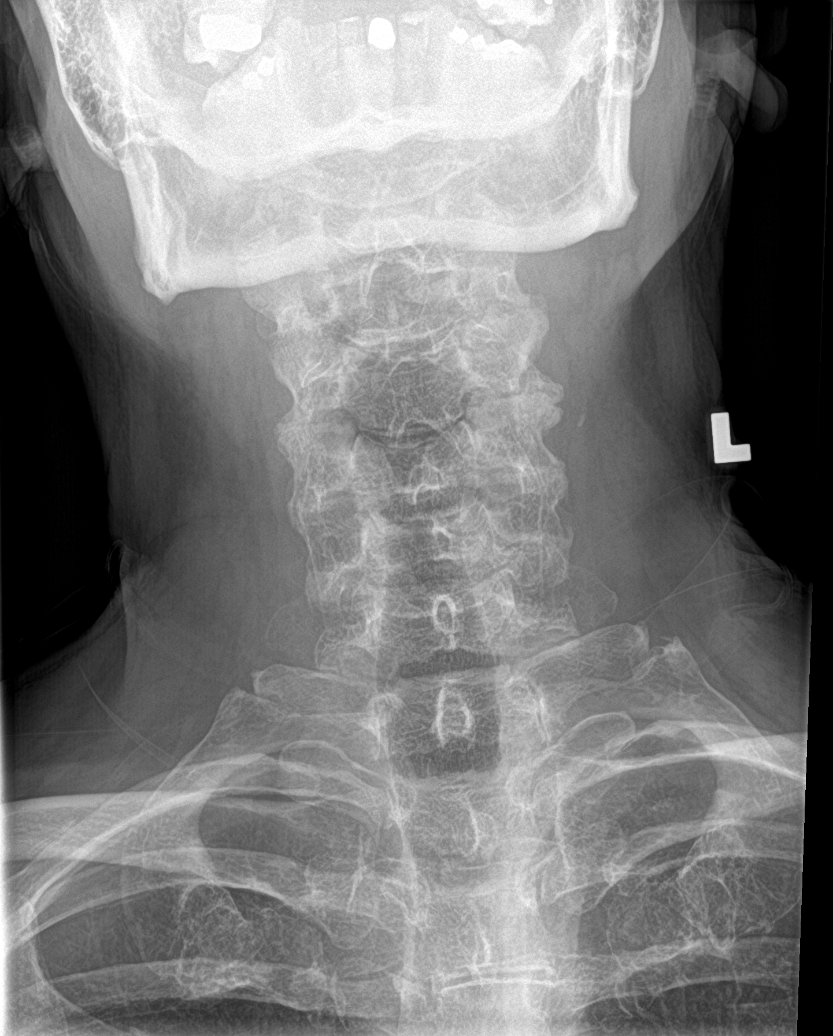

[c-spine open mouth]
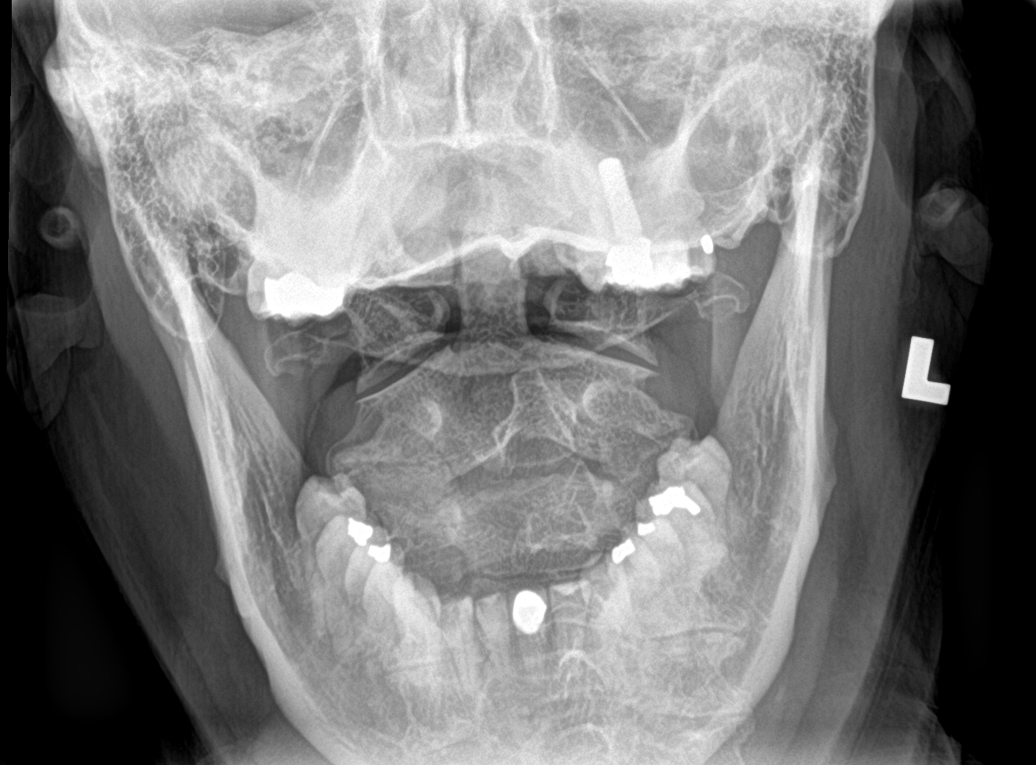

[c-spine swimmers]
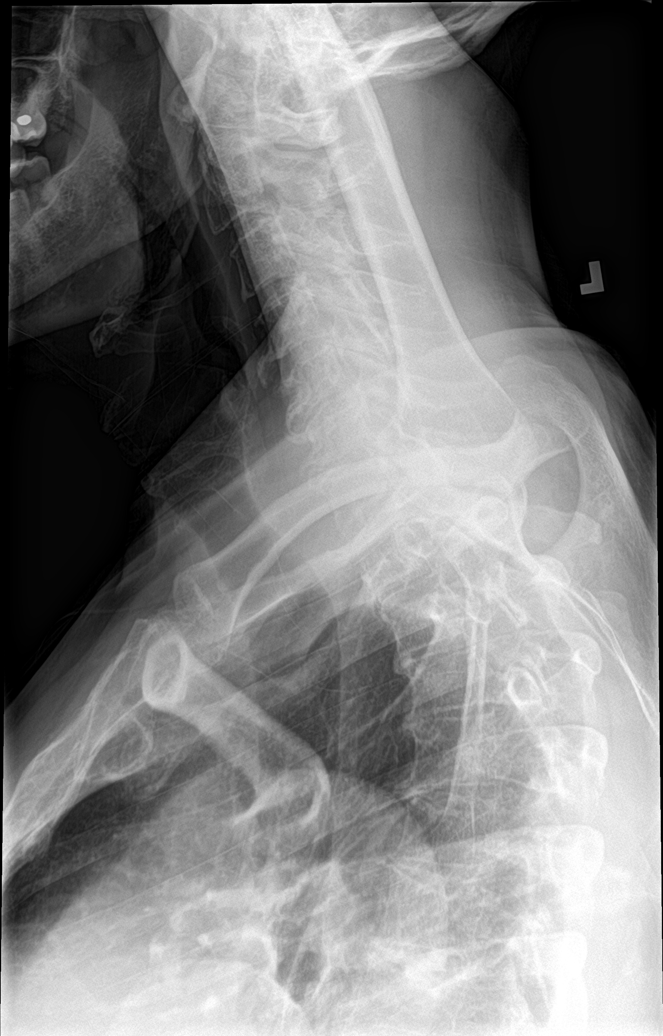

[6 of 6 positions shown; findings below may reference images not displayed]

FINDINGS: Straightening of normal lordosis. No fractures. Multilevel
degenerative disc disease. Uncovertebral degenerative changes.
Neural foramina are patent. Lung apices are normal.
IMPRESSION: Degenerative changes as above.

## 2020-10-18 NOTE — Progress Notes (Signed)
    Procedures performed today:    None.  Independent interpretation of notes and tests performed by another provider:   None.  Brief History, Exam, Impression, and Recommendations:    DDD (degenerative disc disease), cervical This is a pleasant 65 year old male, he has had months of pain in his neck, right periscapular region, trapezius with radiation down the arm, over the elbow with occasional cramping in both forearms. Known DDD on x-rays in the distant past. On exam he does not have any rotator cuff signs, he does have a positive Spurling sign on the right reproduction of pain. We discussed some of the natural history of cervical DDD as well as the expected treatment. He desires a nonpharmacologic approach. Adding x-rays, formal physical therapy including ultrasound, dry needling, iontophoresis and phonophoresis. Return to see me in approximately 6 weeks and we can consider MRI for interventional planning if not sufficiently better.  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 He has worn a sleeve but has never had formal immobilization. He understands that we probably need to place a cast for some time, he would like to revisit this in January. Adding extensor carpi ulnaris conditioning, return to see me in January for consideration of cast if still symptomatic.  I spent 30 minutes of total time managing this patient today, this includes chart review, face to face, and non-face to face time.  Specifically we discussed the natural history, anatomy, pathophysiology of cervical DDD and ECU subluxation.   ___________________________________________ Gwen Her. Dianah Field, M.D., ABFM., CAQSM. Primary Care and [place] Instructor of [employer] [facility] [employer]

## 2020-10-18 NOTE — Assessment & Plan Note (Signed)
This is a pleasant 65 year old male, he has had months of pain in his neck, right periscapular region, trapezius with radiation down the arm, over the elbow with occasional cramping in both forearms. Known DDD on x-rays in the distant past. On exam he does not have any rotator cuff signs, he does have a positive Spurling sign on the right reproduction of pain. We discussed some of the natural history of cervical DDD as well as the expected treatment. He desires a nonpharmacologic approach. Adding x-rays, formal physical therapy including ultrasound, dry needling, iontophoresis and phonophoresis. Return to see me in approximately 6 weeks and we can consider MRI for interventional planning if not sufficiently better.

## 2020-10-18 NOTE — Assessment & Plan Note (Signed)
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 He has worn a sleeve but has never had formal immobilization. He understands that we probably need to place a cast for some time, he would like to revisit this in January. Adding extensor carpi ulnaris conditioning, return to see me in January for consideration of cast if still symptomatic.

## 2020-10-20 ENCOUNTER — Encounter: Payer: Self-pay | Admitting: Rehabilitative and Restorative Service Providers"

## 2020-10-20 ENCOUNTER — Other Ambulatory Visit: Payer: Self-pay

## 2020-10-20 ENCOUNTER — Ambulatory Visit (INDEPENDENT_AMBULATORY_CARE_PROVIDER_SITE_OTHER): Payer: Medicare Other | Admitting: Rehabilitative and Restorative Service Providers"

## 2020-10-20 DIAGNOSIS — R29898 Other symptoms and signs involving the musculoskeletal system: Secondary | ICD-10-CM

## 2020-10-20 DIAGNOSIS — M542 Cervicalgia: Secondary | ICD-10-CM

## 2020-10-20 DIAGNOSIS — M25511 Pain in right shoulder: Secondary | ICD-10-CM

## 2020-10-20 DIAGNOSIS — R293 Abnormal posture: Secondary | ICD-10-CM | POA: Diagnosis not present

## 2020-10-20 NOTE — Patient Instructions (Addendum)
TENS UNIT: This is helpful for muscle pain and spasm.   Search and Purchase a TENS 7000 2nd edition at www.tenspros.com. It should be less than $30.     TENS unit instructions: Do not shower or bathe with the unit on Turn the unit off before removing electrodes or batteries If the electrodes lose stickiness add a drop of water to the electrodes after they are disconnected from the unit and place on plastic sheet. If you continued to have difficulty, call the TENS unit company to purchase more electrodes. Do not apply lotion on the skin area prior to use. Make sure the skin is clean and dry as this will help prolong the life of the electrodes. After use, always check skin for unusual red areas, rash or other skin difficulties. If there are any skin problems, does not apply electrodes to the same area. Never remove the electrodes from the unit by pulling the wires. Do not use the TENS unit or electrodes other than as directed. Do not change electrode placement without consultating your therapist or physician. Keep 2 fingers with between each electrode. Wear time ratio is 2:1, on to off times.    For example on for 30 minutes off for 15 minutes and then on for 30 minutes off for 15 minutes   Trigger Point Dry Needling  What is Trigger Point Dry Needling (DN)? DN is a physical therapy technique used to treat muscle pain and dysfunction. Specifically, DN helps deactivate muscle trigger points (muscle knots).  A thin filiform needle is used to penetrate the skin and stimulate the underlying trigger point. The goal is for a local twitch response (LTR) to occur and for the trigger point to relax. No medication of any kind is injected during the procedure.   What Does Trigger Point Dry Needling Feel Like?  The procedure feels different for each individual patient. Some patients report that they do not actually feel the needle enter the skin and overall the process is not painful. Very mild  bleeding may occur. However, many patients feel a deep cramping in the muscle in which the needle was inserted. This is the local twitch response.   How Will I feel after the treatment? Soreness is normal, and the onset of soreness may not occur for a few hours. Typically this soreness does not last longer than two days.  Bruising is uncommon, however; ice can be used to decrease any possible bruising.  In rare cases feeling tired or nauseous after the treatment is normal. In addition, your symptoms may get worse before they get better, this period will typically not last longer than 24 hours.   What Can I do After My Treatment? Increase your hydration by drinking more water for the next 24 hours. You may place ice or heat on the areas treated that have become sore, however, do not use heat on inflamed or bruised areas. Heat often brings more relief post needling. You can continue your regular activities, but vigorous activity is not recommended initially after the treatment for 24 hours. DN is best combined with other physical therapy such as strengthening, stretching, and other therapies.   Access Code: [email or web address] URL: [email or web address] Date: 10/20/2020 Prepared by: Gillermo Murdoch  Exercises Seated Cervical Retraction - 3 x daily - 7 x weekly - 10 reps - 1 sets Standing Scapular Retraction - 3 x daily - 7 x weekly - 1 sets - 10 reps - 10 hold Shoulder External Rotation and Scapular Retraction - 3  x daily - 7 x weekly - 1 sets - 10 reps - 3 hold Shoulder External Rotation in 45 Degrees Abduction - 2 x daily - 7 x weekly - 1-2 sets - 10 reps - 3 sec hold

## 2020-10-20 NOTE — Therapy (Signed)
[place], [place], [address] Phone: [phone]   Fax:  [phone]  Physical Therapy Evaluation  Patient Details  Name: Brett Garner MRN: [id number] Date of Birth: 06-02-1955 Referring Provider (PT): Dr Dianah Field   Encounter Date: 10/20/2020   PT End of Session - 10/20/20 1452     Visit Number 1    Number of Visits 12    Date for PT Re-Evaluation 12/01/20    PT Start Time [id number]    PT Stop Time 1530    PT Time Calculation (min) 45 min    Activity Tolerance Patient tolerated treatment well             History reviewed. No pertinent past medical history.  Past Surgical History:  Procedure Laterality Date   LITHOTRIPSY Left     There were no vitals filed for this visit.    Subjective Assessment - 10/20/20 1434     Subjective Patient reports that he had Lt UE pain ~ 25 years ago. Symptoms were treated conservatively with yoga. He stopped yoga during [place] and has not done his yoga. He had new guns in June and July without recoil pad and then moved a lot of soil in the backyard. He began to have Rt elbow pain which has now migrated to the shoulder. He was seen by Dr T and was diagnosed with DDD of cervical spine and periscapular upper trap pain.                [facility] PT Assessment - 10/20/20 0001       Assessment   Medical Diagnosis Cervical dysfunction    Referring Provider (PT) Dr Dianah Field    Onset Date/Surgical Date 08/04/20    Hand Dominance Right    Next MD Visit 10/10    Prior Therapy yes in past for cervical radiculopathy; continued with yoga; chiropractic care      Precautions   Precautions None      Restrictions   Weight Bearing Restrictions No      Balance Screen   Has the patient fallen in the past 6 months No    Has the patient had a decrease in activity level because of a fear of falling?  No    Is the patient reluctant to leave their home because of a  fear of falling?  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    Leisure walking 15-20 miles/wk; yard work;      Observation/Other Assessments   Focus on Therapeutic Outcomes (FOTO)  58      Sensation   Additional Comments WFL's per pt report      Posture/Postural Control   Posture Comments head forward and shifted to Rt; shoulders rounded and elevated; head of the humerus anterior in orientation; scapulae abducted and rotated along the thoracic wall      AROM   Right Shoulder Flexion 144 Degrees    Right Shoulder ABduction 150 Degrees    Right Shoulder External Rotation 90 Degrees    Left Shoulder Flexion 148 Degrees    Left Shoulder ABduction 153 Degrees    Left Shoulder External Rotation 90 Degrees    Cervical Flexion 61    Cervical Extension  29 tightness    Cervical - Right Side Bend 20    Cervical - Left Side Bend 13 tightness    Cervical - Right Rotation 56    Cervical - Left Rotation 40 tightness      Palpation   Spinal mobility hypomobile thoracic and cervical spine with PA mobs    Palpation comment muscular tightness and tenderness to palpation through ant/lat/posterior cervical musculature; pecs; upper trap; leveator                        Objective measurements completed on examination: See above findings.       [facility] Adult PT Treatment/Exercise - 10/20/20 0001       Neuro Re-ed    Neuro Re-ed Details  initiated postural correction encouraging pt to engage posterior shoulder girdle musculature      Shoulder Exercises: Standing   Other Standing Exercises axial extension 5 sec x 5 reps; scap squeeze 10 sec x 5; L's x 5; W's x 5 with noodle along spine                    PT Education - 10/20/20 1516     Education  Details POC; HEP; DN; TENS    Person(s) Educated Patient    Methods Explanation;Demonstration;Tactile cues;Verbal cues;Handout    Comprehension Verbalized understanding;Returned demonstration;Verbal cues required;Tactile cues required                 PT Long Term Goals - 10/20/20 1734       PT LONG TERM GOAL #1   Title Decrease pain in the Rt shoulder and UE by 75-100% allowing patient to perform all functional activities at prior level of function    Time 6    Period Weeks    Status New    Target Date 12/01/20      PT LONG TERM GOAL #2   Title Improve posture and alignment with patient to demonstrate improved upright posture with posterior shoulder girdle engaged    Time 6    Period Weeks    Status New    Target Date 12/01/20      PT LONG TERM GOAL #3   Title Increase cervical ROM in Lt rotation and Lt lateral flexion by 5-10 deg    Time 6    Period Weeks    Status New    Target Date 12/01/20      PT LONG TERM GOAL #4   Title Independent in HEP (including aquatic program as indicated)    Time 6    Period Weeks    Status New    Target Date 12/01/20      PT LONG TERM GOAL #5   Title Improve functional limitation score to 72    Time 6    Period Weeks    Status New    Target Date 12/01/20                    Plan - 10/20/20 1705     Clinical Impression Statement Patient presents with acute flare up of chronic Rt shoulder pain in the past two months. Symptoms began after he spent time shooting new rifles in June and July followed by moving dirt in his back yard. He has had pain in the posterior shoulder area as well as into the Rt elbow. Patient has a history of cervical radiculopathy; lateral epidondylitis in bilat UE's (numerous times per pt report). He  has poor posture and aignment; limited cervical and shoulder mobility/ROM; muscular tightness to palpation; pain with functional activities. Patient will benefit from PT to address problems identified.     Stability/Clinical Decision Making Stable/Uncomplicated    Clinical Decision Making Low    Rehab Potential Good    PT Frequency 2x / week    PT Duration 6 weeks    PT Treatment/Interventions ADLs/Self Care Home Management;Aquatic Therapy;Cryotherapy;Electrical Stimulation;Iontophoresis '4mg'$ /ml Dexamethasone;Moist Heat;Ultrasound;Therapeutic activities;Therapeutic exercise;Neuromuscular re-education;Patient/family education;Manual techniques;Dry needling;Taping;Passive range of motion;Vasopneumatic Device    PT Next Visit Plan review HEP; progress with posterior shoulder girdle strengthening; pec stretch; postural correction; DN/manual work through the Rt shoulder girdle/thoracic spine; PA thoracic mobs; possible trial of TENS for home pain management; modalities as indicated    PT Home Exercise Plan [id number]    Consulted and Agree with Plan of Care Patient             Patient will benefit from skilled therapeutic intervention in order to improve the following deficits and impairments:  Decreased range of motion, Pain, Improper body mechanics, Hypomobility, Decreased mobility, Decreased strength, Postural dysfunction  Visit Diagnosis: Cervicalgia  Acute pain of right shoulder  Abnormal posture  Other symptoms and signs involving the musculoskeletal system     Problem List Patient Active Problem List   Diagnosis Date Noted   DDD (degenerative disc disease), cervical 10/18/2020   Forearm strain, right, initial encounter 09/13/2020   Chronic pain of right wrist 09/13/2020   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

## 2020-10-22 ENCOUNTER — Ambulatory Visit (INDEPENDENT_AMBULATORY_CARE_PROVIDER_SITE_OTHER): Payer: Medicare Other | Admitting: Physical Therapy

## 2020-10-22 ENCOUNTER — Other Ambulatory Visit: Payer: Self-pay

## 2020-10-22 DIAGNOSIS — R29898 Other symptoms and signs involving the musculoskeletal system: Secondary | ICD-10-CM | POA: Diagnosis not present

## 2020-10-22 DIAGNOSIS — M25511 Pain in right shoulder: Secondary | ICD-10-CM | POA: Diagnosis not present

## 2020-10-22 DIAGNOSIS — M542 Cervicalgia: Secondary | ICD-10-CM | POA: Diagnosis present

## 2020-10-22 DIAGNOSIS — R293 Abnormal posture: Secondary | ICD-10-CM

## 2020-10-22 NOTE — Patient Instructions (Signed)
Access Code: [email or web address] URL: [email or web address] Date: 10/22/2020 Prepared by: Isabelle Course  Exercises Seated Cervical Retraction - 3 x daily - 7 x weekly - 10 reps - 1 sets Shoulder External Rotation and Scapular Retraction - 3 x daily - 7 x weekly - 1 sets - 10 reps - 3 hold Shoulder External Rotation in 45 Degrees Abduction - 2 x daily - 7 x weekly - 1-2 sets - 10 reps - 3 sec hold Sidelying Open Book Thoracic Lumbar Rotation and Extension - 1 x daily - 7 x weekly - 1 sets - 10 reps - 3 sec hold

## 2020-10-22 NOTE — Therapy (Signed)
[place], [place], [address] Phone: [phone]   Fax:  [phone]  Physical Therapy Treatment  Patient Details  Name: Brett Garner MRN: [id number] Date of Birth: September 10, 1955 Referring Provider (PT): Dr Dianah Field   Encounter Date: 10/22/2020   PT End of Session - 10/22/20 1400     Visit Number 2    Number of Visits 12    Date for PT Re-Evaluation 12/01/20    PT Start Time 1315    PT Stop Time 1400    PT Time Calculation (min) 45 min    Activity Tolerance Patient tolerated treatment well    Behavior During Therapy [facility] for tasks assessed/performed             No past medical history on file.  Past Surgical History:  Procedure Laterality Date   LITHOTRIPSY Left     There were no vitals filed for this visit.   Subjective Assessment - 10/22/20 1318     Subjective Pt reports he had some pain during HEP at the top of his spine    Currently in Pain? Yes    Pain Score 3     Pain Location Elbow    Pain Orientation Right    Pain Descriptors / Indicators --   strain   Pain Type Chronic pain                               [facility] Adult PT Treatment/Exercise - 10/22/20 0001       Exercises   Exercises Shoulder      Shoulder Exercises: Sidelying   Other Sidelying Exercises open book x 10 bilat with cuing for technique      Shoulder Exercises: Standing   Other Standing Exercises scap squeeze with tactile cues for posture and technique      Shoulder Exercises: ROM/Strengthening   UBE (Upper Arm Bike) level 2 x 2 min alt fwd/bkwd    Other ROM/Strengthening Exercises bilat ER with pool noodle behind back to improve thoracic and scapular mobility, Ls with pool noodle behind back      Shoulder Exercises: Stretch   Other Shoulder Stretches attempted doorway stretch, unable to tolerate on Rt side      Manual Therapy   Manual Therapy Soft tissue mobilization;Joint  mobilization;Scapular mobilization    Manual therapy comments PT educated pt on self massage wtih foam roll on thoracic spine    Joint Mobilization CPAs and UPAS thoracic spine T1-T8 grade 2-3    Soft tissue mobilization STM thoracic paraspinals, subscap, upper traps, rhomboids, levator    Scapular Mobilization bilat in all directions to tolerance                    PT Education - 10/22/20 1355     Education Details updated HEP    Person(s) Educated Patient    Methods Explanation;Demonstration;Handout    Comprehension Verbalized understanding;Returned demonstration                 PT Long Term Goals - 10/20/20 1734       PT LONG TERM GOAL #1   Title Decrease pain in the Rt shoulder and UE by 75-100% allowing patient to perform all functional activities at prior level of function    Time 6    Period Weeks    Status New    Target Date 12/01/20  PT LONG TERM GOAL #2   Title Improve posture and alignment with patient to demonstrate improved upright posture with posterior shoulder girdle engaged    Time 6    Period Weeks    Status New    Target Date 12/01/20      PT LONG TERM GOAL #3   Title Increase cervical ROM in Lt rotation and Lt lateral flexion by 5-10 deg    Time 6    Period Weeks    Status New    Target Date 12/01/20      PT LONG TERM GOAL #4   Title Independent in HEP (including aquatic program as indicated)    Time 6    Period Weeks    Status New    Target Date 12/01/20      PT LONG TERM GOAL #5   Title Improve functional limitation score to 72    Time 6    Period Weeks    Status New    Target Date 12/01/20                   Plan - 10/22/20 1402     Clinical Impression Statement Pt with decreased scapular mobility and decreased thoracic spine mobility. Pt able to tolerate manual work with frequent rest breaks due to discomfort. Pt with some increase in scapular mobility and decreased pain after manual therapy. Added open  book to focus on thoracic mobility at home    PT Next Visit Plan postural strength, manual work to improve scapular and thoracic mobility    PT Home Exercise Plan [id number]    Consulted and Agree with Plan of Care Patient             Patient will benefit from skilled therapeutic intervention in order to improve the following deficits and impairments:     Visit Diagnosis: Cervicalgia  Acute pain of right shoulder  Abnormal posture  Other symptoms and signs involving the musculoskeletal system     Problem List Patient Active Problem List   Diagnosis Date Noted   DDD (degenerative disc disease), cervical 10/18/2020   Forearm strain, right, initial encounter 09/13/2020   Chronic pain of right wrist 09/13/2020   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

## 2020-10-29 ENCOUNTER — Ambulatory Visit (INDEPENDENT_AMBULATORY_CARE_PROVIDER_SITE_OTHER): Payer: Medicare Other | Admitting: Physical Therapy

## 2020-10-29 ENCOUNTER — Other Ambulatory Visit: Payer: Self-pay

## 2020-10-29 DIAGNOSIS — M25511 Pain in right shoulder: Secondary | ICD-10-CM | POA: Diagnosis not present

## 2020-10-29 DIAGNOSIS — R293 Abnormal posture: Secondary | ICD-10-CM

## 2020-10-29 DIAGNOSIS — M542 Cervicalgia: Secondary | ICD-10-CM

## 2020-10-29 DIAGNOSIS — R29898 Other symptoms and signs involving the musculoskeletal system: Secondary | ICD-10-CM | POA: Diagnosis not present

## 2020-10-29 NOTE — Therapy (Signed)
[place], [place], [address] Phone: [phone]   Fax:  [phone]  Physical Therapy Treatment  Patient Details  Name: Brett Garner MRN: [id number] Date of Birth: March 03, 1955 Referring Provider (PT): Dr Dianah Field   Encounter Date: 10/29/2020   PT End of Session - 10/29/20 1005     Visit Number 3    Number of Visits 12    Date for PT Re-Evaluation 12/01/20    PT Start Time 0920    PT Stop Time 1005    PT Time Calculation (min) 45 min    Activity Tolerance Patient tolerated treatment well    Behavior During Therapy [facility] for tasks assessed/performed             No past medical history on file.  Past Surgical History:  Procedure Laterality Date   LITHOTRIPSY Left     There were no vitals filed for this visit.   Subjective Assessment - 10/29/20 0916     Subjective Pt states he has been doing some yoga moves to stretch his shoulders and back, he states that this helped    Currently in Pain? No/denies                               [facility] Adult PT Treatment/Exercise - 10/29/20 0001       Shoulder Exercises: Supine   Horizontal ABduction 20 reps    Horizontal ABduction Limitations laying on pool noodle    Diagonals 10 reps;Left;Right    Diagonals Limitations laying on pool noodle      Shoulder Exercises: Prone   Other Prone Exercises freestyle swim simulation Rt UE x 10      Shoulder Exercises: ROM/Strengthening   UBE (Upper Arm Bike) level 3 x 3 min alt fwd/bkwd    Other ROM/Strengthening Exercises bilat ER with pool noodle behind back to improve thoracic and scapular mobility, Ls with pool noodle behind back      Manual Therapy   Joint Mobilization CPAs and UPAS thoracic spine T1-T8 grade 2-3    Soft tissue mobilization STM thoracic paraspinals, subscap, upper traps, rhomboids, levator    Scapular Mobilization bilat in all directions to tolerance                      PT Education - 10/29/20 1004     Education Details updated HEP    Person(s) Educated Patient    Methods Explanation;Handout;Demonstration    Comprehension Returned demonstration;Verbalized understanding                 PT Long Term Goals - 10/20/20 1734       PT LONG TERM GOAL #1   Title Decrease pain in the Rt shoulder and UE by 75-100% allowing patient to perform all functional activities at prior level of function    Time 6    Period Weeks    Status New    Target Date 12/01/20      PT LONG TERM GOAL #2   Title Improve posture and alignment with patient to demonstrate improved upright posture with posterior shoulder girdle engaged    Time 6    Period Weeks    Status New    Target Date 12/01/20      PT LONG TERM GOAL #3   Title Increase cervical ROM in Lt rotation and Lt lateral flexion by 5-10 deg  Time 6    Period Weeks    Status New    Target Date 12/01/20      PT LONG TERM GOAL #4   Title Independent in HEP (including aquatic program as indicated)    Time 6    Period Weeks    Status New    Target Date 12/01/20      PT LONG TERM GOAL #5   Title Improve functional limitation score to 72    Time 6    Period Weeks    Status New    Target Date 12/01/20                   Plan - 10/29/20 1006     Clinical Impression Statement Pt with improved scapular mobility this visit, able to better tolerate manual therapy today. good stretch with addition of diagonals on pool noodle    PT Next Visit Plan postural strength, manual work to improve scapular and thoracic mobility    PT Home Exercise Plan [id number]    Consulted and Agree with Plan of Care Patient             Patient will benefit from skilled therapeutic intervention in order to improve the following deficits and impairments:     Visit Diagnosis: Cervicalgia  Acute pain of right shoulder  Abnormal posture  Other symptoms and signs involving the  musculoskeletal system     Problem List Patient Active Problem List   Diagnosis Date Noted   DDD (degenerative disc disease), cervical 10/18/2020   Forearm strain, right, initial encounter 09/13/2020   Chronic pain of right wrist 09/13/2020   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 [place], [place], [address] Phone: [phone]   Fax:  [phone]  Name: Brett Garner MRN: [id number] Date of Birth: [birth year]

## 2020-10-29 NOTE — Patient Instructions (Signed)
Access Code: [email or web address] URL: [email or web address] Date: 10/29/2020 Prepared by: Isabelle Course  Exercises Seated Cervical Retraction - 3 x daily - 7 x weekly - 10 reps - 1 sets Shoulder External Rotation and Scapular Retraction - 3 x daily - 7 x weekly - 1 sets - 10 reps - 3 hold Shoulder External Rotation in 45 Degrees Abduction - 2 x daily - 7 x weekly - 1-2 sets - 10 reps - 3 sec hold Sidelying Open Book Thoracic Lumbar Rotation and Extension - 1 x daily - 7 x weekly - 1 sets - 10 reps - 3 sec hold Supine PNF D2 - 1 x daily - 7 x weekly - 2 sets - 10 reps

## 2020-11-02 ENCOUNTER — Other Ambulatory Visit: Payer: Self-pay

## 2020-11-02 ENCOUNTER — Ambulatory Visit (INDEPENDENT_AMBULATORY_CARE_PROVIDER_SITE_OTHER): Payer: Medicare Other | Admitting: Physical Therapy

## 2020-11-02 DIAGNOSIS — R293 Abnormal posture: Secondary | ICD-10-CM | POA: Diagnosis not present

## 2020-11-02 DIAGNOSIS — M25511 Pain in right shoulder: Secondary | ICD-10-CM | POA: Diagnosis not present

## 2020-11-02 DIAGNOSIS — M542 Cervicalgia: Secondary | ICD-10-CM

## 2020-11-02 DIAGNOSIS — R29898 Other symptoms and signs involving the musculoskeletal system: Secondary | ICD-10-CM | POA: Diagnosis not present

## 2020-11-02 NOTE — Patient Instructions (Signed)
Access Code: [email or web address] URL: [email or web address] Date: 11/02/2020 Prepared by: Isabelle Course  Exercises Shoulder External Rotation and Scapular Retraction - 3 x daily - 7 x weekly - 1 sets - 10 reps - 3 hold Shoulder External Rotation in 45 Degrees Abduction - 2 x daily - 7 x weekly - 1-2 sets - 10 reps - 3 sec hold Sidelying Open Book Thoracic Lumbar Rotation and Extension - 1 x daily - 7 x weekly - 1 sets - 10 reps - 3 sec hold Supine PNF D2 - 1 x daily - 7 x weekly - 2 sets - 10 reps Drawing Bow - 1 x daily - 7 x weekly - 2 sets - 10 reps

## 2020-11-02 NOTE — Therapy (Signed)
[place], [place], [address] Phone: [phone]   Fax:  [phone]  Physical Therapy Treatment  Patient Details  Name: Brett Garner MRN: [id number] Date of Birth: 1955/09/14 Referring Provider (PT): Dr Dianah Field   Encounter Date: 11/02/2020   PT End of Session - 11/02/20 0844     Visit Number 4    Number of Visits 12    Date for PT Re-Evaluation 12/01/20    PT Start Time 0800    PT Stop Time 0843    PT Time Calculation (min) 43 min    Activity Tolerance Patient tolerated treatment well    Behavior During Therapy [facility] for tasks assessed/performed             No past medical history on file.  Past Surgical History:  Procedure Laterality Date   LITHOTRIPSY Left     There were no vitals filed for this visit.   Subjective Assessment - 11/02/20 0804     Subjective Pt states he has continued to do more stretching and is feeling more "release" on his Lt side    Currently in Pain? No/denies                [facility] PT Assessment - 11/02/20 0001       Assessment   Medical Diagnosis Cervical dysfunction    Referring Provider (PT) Dr Dianah Field    Onset Date/Surgical Date 08/04/20    Hand Dominance Right    Next MD Visit 10/10    Prior Therapy yes in past for cervical radiculopathy; continued with yoga; chiropractic care      AROM   Right Shoulder Flexion 160 Degrees    Left Shoulder Flexion 160 Degrees                           [facility] Adult PT Treatment/Exercise - 11/02/20 0001       Shoulder Exercises: Supine   Horizontal ABduction 20 reps    Theraband Level (Shoulder Horizontal ABduction) Level 2 (Red)    Horizontal ABduction Limitations laying on pool noodle    Diagonals 10 reps;Left;Right    Theraband Level (Shoulder Diagonals) Level 2 (Red)    Diagonals Limitations laying on pool noodle      Shoulder Exercises: Standing   Other Standing Exercises  bow and arrow green TB x 10 bilat with cuing for technique and coordination      Shoulder Exercises: ROM/Strengthening   UBE (Upper Arm Bike) level 3 x 3 min alt fwd/bkwd    Other ROM/Strengthening Exercises bilat ER red TB with pool noodle behind back x 20      Shoulder Exercises: Stretch   Other Shoulder Stretches doorway stretch 60 degrees 2 x 20 sec with cues for technique      Manual Therapy   Joint Mobilization CPAs and UPAS thoracic spine T1-T8 grade 2-3    Soft tissue mobilization STM thoracic paraspinals, subscap, upper traps, rhomboids, levator    Scapular Mobilization bilat in all directions to tolerance                          PT Long Term Goals - 10/20/20 1734       PT LONG TERM GOAL #1   Title Decrease pain in the Rt shoulder and UE by 75-100% allowing patient to perform all functional activities at prior level of function    Time  6    Period Weeks    Status New    Target Date 12/01/20      PT LONG TERM GOAL #2   Title Improve posture and alignment with patient to demonstrate improved upright posture with posterior shoulder girdle engaged    Time 6    Period Weeks    Status New    Target Date 12/01/20      PT LONG TERM GOAL #3   Title Increase cervical ROM in Lt rotation and Lt lateral flexion by 5-10 deg    Time 6    Period Weeks    Status New    Target Date 12/01/20      PT LONG TERM GOAL #4   Title Independent in HEP (including aquatic program as indicated)    Time 6    Period Weeks    Status New    Target Date 12/01/20      PT LONG TERM GOAL #5   Title Improve functional limitation score to 72    Time 6    Period Weeks    Status New    Target Date 12/01/20                   Plan - 11/02/20 0844     Clinical Impression Statement Pt continues with improved mobility in shoulders, scapula and cervical spine    PT Next Visit Plan postural strength, manual work to improve scapular and thoracic mobility, dry needle    PT  Home Exercise Plan [id number]    Consulted and Agree with Plan of Care Patient             Patient will benefit from skilled therapeutic intervention in order to improve the following deficits and impairments:     Visit Diagnosis: Cervicalgia  Acute pain of right shoulder  Abnormal posture  Other symptoms and signs involving the musculoskeletal system     Problem List Patient Active Problem List   Diagnosis Date Noted   DDD (degenerative disc disease), cervical 10/18/2020   Forearm strain, right, initial encounter 09/13/2020   Chronic pain of right wrist 09/13/2020   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, [place], [address] Phone: [phone]   Fax:  [phone]  Name: Brett Garner MRN: [id number] Date of Birth: 10/11/1955

## 2020-11-05 ENCOUNTER — Encounter: Payer: Self-pay | Admitting: Physical Therapy

## 2020-11-05 ENCOUNTER — Other Ambulatory Visit: Payer: Self-pay

## 2020-11-05 ENCOUNTER — Ambulatory Visit (INDEPENDENT_AMBULATORY_CARE_PROVIDER_SITE_OTHER): Payer: Medicare Other | Admitting: Rehabilitative and Restorative Service Providers"

## 2020-11-05 DIAGNOSIS — R293 Abnormal posture: Secondary | ICD-10-CM

## 2020-11-05 DIAGNOSIS — M542 Cervicalgia: Secondary | ICD-10-CM

## 2020-11-05 DIAGNOSIS — M25511 Pain in right shoulder: Secondary | ICD-10-CM | POA: Diagnosis not present

## 2020-11-05 DIAGNOSIS — R29898 Other symptoms and signs involving the musculoskeletal system: Secondary | ICD-10-CM

## 2020-11-05 NOTE — Therapy (Addendum)
[place], [place], [address] Phone: [phone]   Fax:  [phone]  Physical Therapy Treatment  Patient Details  Name: Brett Garner MRN: [id number] Date of Birth: May 18, 1955 Referring Provider (PT): Dr Dianah Field   Encounter Date: 11/05/2020   PT End of Session - 11/05/20 0807     Visit Number 5    Number of Visits 12    Date for PT Re-Evaluation 12/01/20    PT Start Time 0801    PT Stop Time 0845    PT Time Calculation (min) 44 min    Activity Tolerance Patient tolerated treatment well             History reviewed. No pertinent past medical history.  Past Surgical History:  Procedure Laterality Date   LITHOTRIPSY Left     There were no vitals filed for this visit.   Subjective Assessment - 11/05/20 0808     Subjective Patient reports that he can feel some movement in the neck now. Can tell he has increased mobility. Working on his exercises at home.    Currently in Pain? No/denies    Pain Score 0-No pain                               [facility] Adult PT Treatment/Exercise - 11/05/20 0001       Shoulder Exercises: Prone   Other Prone Exercises axial extension with scap squeeze 5 sec x 10 reps      Shoulder Exercises: ROM/Strengthening   UBE (Upper Arm Bike) level 4 x 4 min alt fwd/bkwd      Shoulder Exercises: Stretch   Other Shoulder Stretches doorway stretch 3 positions x 2 reps x 30 sec hold VC/TC to avoid wt through UE's    Other Shoulder Stretches piriformis stretch 2 reps x 30 sec supine travell      Manual Therapy   Manual Therapy Soft tissue mobilization;Joint mobilization;Scapular mobilization    Manual therapy comments skilled palpation to assess response to DN and manual work    Joint Mobilization CPAs and UPAS thoracic spine T1-T8 grade 2-3    Soft tissue mobilization STM thoracic paraspinals, subscap, upper traps, rhomboids, levator     Scapular Mobilization bilat in all directions to tolerance              Trigger Point Dry Needling - 11/05/20 0001     Consent Given? Yes    Education Handout Provided Yes    Dry Needling Comments bilat    Upper Trapezius Response Palpable increased muscle length;Twitch reponse elicited    Thoracic multifidi response Palpable increased muscle length;Twitch response elicited                   PT Education - 11/05/20 [id number]     Education Details HEP DN    Person(s) Educated Patient    Methods Explanation;Demonstration;Tactile cues;Verbal cues;Handout    Comprehension Verbalized understanding;Returned demonstration;Verbal cues required;Tactile cues required                 PT Long Term Goals - 10/20/20 1734       PT LONG TERM GOAL #1   Title Decrease pain in the Rt shoulder and UE by 75-100% allowing patient to perform all functional activities at prior level of function    Time 6    Period Weeks    Status New    Target  Date 12/01/20      PT LONG TERM GOAL #2   Title Improve posture and alignment with patient to demonstrate improved upright posture with posterior shoulder girdle engaged    Time 6    Period Weeks    Status New    Target Date 12/01/20      PT LONG TERM GOAL #3   Title Increase cervical ROM in Lt rotation and Lt lateral flexion by 5-10 deg    Time 6    Period Weeks    Status New    Target Date 12/01/20      PT LONG TERM GOAL #4   Title Independent in HEP (including aquatic program as indicated)    Time 6    Period Weeks    Status New    Target Date 12/01/20      PT LONG TERM GOAL #5   Title Improve functional limitation score to 72    Time 6    Period Weeks    Status New    Target Date 12/01/20                   Plan - 11/05/20 [id number]     Clinical Impression Statement Patient continues to have forward posture and alignment and mucular tightness through pecs, ant/lat/posterior cervical musculature. Trial of DN  thoracic paraspinals and upper traps.    Rehab Potential Good    PT Frequency 2x / week    PT Duration 6 weeks    PT Treatment/Interventions ADLs/Self Care Home Management;Aquatic Therapy;Cryotherapy;Electrical Stimulation;Iontophoresis '4mg'$ /ml Dexamethasone;Moist Heat;Ultrasound;Therapeutic activities;Therapeutic exercise;Neuromuscular re-education;Patient/family education;Manual techniques;Dry needling;Taping;Passive range of motion;Vasopneumatic Device    PT Next Visit Plan postural strength, manual work to improve scapular and thoracic mobility, dry needle    PT Home Exercise Plan [id number]    Consulted and Agree with Plan of Care Patient             Patient will benefit from skilled therapeutic intervention in order to improve the following deficits and impairments:     Visit Diagnosis: Cervicalgia  Acute pain of right shoulder  Abnormal posture  Other symptoms and signs involving the musculoskeletal system     Problem List Patient Active Problem List   Diagnosis Date Noted   DDD (degenerative disc disease), cervical 10/18/2020   Forearm strain, right, initial encounter 09/13/2020   Chronic pain of right wrist 09/13/2020   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

## 2020-11-05 NOTE — Patient Instructions (Addendum)
Trigger Point Dry Needling  What is Trigger Point Dry Needling (DN)? DN is a physical therapy technique used to treat muscle pain and dysfunction. Specifically, DN helps deactivate muscle trigger points (muscle knots).  A thin filiform needle is used to penetrate the skin and stimulate the underlying trigger point. The goal is for a local twitch response (LTR) to occur and for the trigger point to relax. No medication of any kind is injected during the procedure.   What Does Trigger Point Dry Needling Feel Like?  The procedure feels different for each individual patient. Some patients report that they do not actually feel the needle enter the skin and overall the process is not painful. Very mild bleeding may occur. However, many patients feel a deep cramping in the muscle in which the needle was inserted. This is the local twitch response.   How Will I feel after the treatment? Soreness is normal, and the onset of soreness may not occur for a few hours. Typically this soreness does not last longer than two days.  Bruising is uncommon, however; ice can be used to decrease any possible bruising.  In rare cases feeling tired or nauseous after the treatment is normal. In addition, your symptoms may get worse before they get better, this period will typically not last longer than 24 hours.   What Can I do After My Treatment? Increase your hydration by drinking more water for the next 24 hours. You may place ice or heat on the areas treated that have become sore, however, do not use heat on inflamed or bruised areas. Heat often brings more relief post needling. You can continue your regular activities, but vigorous activity is not recommended initially after the treatment for 24 hours. DN is best combined with other physical therapy such as strengthening, stretching, and other therapies.   Access Code: [email or web address] URL: [email or web address] Date: 11/05/2020 Prepared by: Gillermo Murdoch  Exercises Shoulder External Rotation and Scapular Retraction - 3 x daily - 7 x weekly - 1 sets - 10 reps - 3 hold Shoulder External Rotation in 45 Degrees Abduction - 2 x daily - 7 x weekly - 1-2 sets - 10 reps - 3 sec hold Sidelying Open Book Thoracic Lumbar Rotation and Extension - 1 x daily - 7 x weekly - 1 sets - 10 reps - 3 sec hold Supine PNF D2 - 1 x daily - 7 x weekly - 2 sets - 10 reps Drawing Bow - 1 x daily - 7 x weekly - 2 sets - 10 reps Doorway Pec Stretch at 60 Degrees Abduction - 3 x daily - 7 x weekly - 3 reps - 1 sets Doorway Pec Stretch at 90 Degrees Abduction - 3 x daily - 7 x weekly - 3 reps - 1 sets - 30 seconds hold Doorway Pec Stretch at 120 Degrees Abduction - 3 x daily - 7 x weekly - 3 reps - 1 sets - 30 second hold hold Prone Scapular Retraction - 2 x daily - 7 x weekly - 1 sets - 5-10 reps - 3-5 sec hold Supine Piriformis Stretch with Leg Straight - 2 x daily - 7 x weekly - 1 sets - 3 reps - 30 sec hold

## 2020-11-09 ENCOUNTER — Ambulatory Visit (INDEPENDENT_AMBULATORY_CARE_PROVIDER_SITE_OTHER): Payer: Medicare Other | Admitting: Physical Therapy

## 2020-11-09 ENCOUNTER — Other Ambulatory Visit: Payer: Self-pay

## 2020-11-09 DIAGNOSIS — R29898 Other symptoms and signs involving the musculoskeletal system: Secondary | ICD-10-CM | POA: Diagnosis not present

## 2020-11-09 DIAGNOSIS — R293 Abnormal posture: Secondary | ICD-10-CM

## 2020-11-09 DIAGNOSIS — M25511 Pain in right shoulder: Secondary | ICD-10-CM | POA: Diagnosis not present

## 2020-11-09 DIAGNOSIS — M542 Cervicalgia: Secondary | ICD-10-CM

## 2020-11-09 NOTE — Therapy (Signed)
[place], [place], [address] Phone: [phone]   Fax:  [phone]  Physical Therapy Treatment  Patient Details  Name: Brett Garner MRN: [id number] Date of Birth: 1955/06/13 Referring Provider (PT): Dr Dianah Field   Encounter Date: 11/09/2020   PT End of Session - 11/09/20 0927     Visit Number 6    Number of Visits 12    Date for PT Re-Evaluation 12/01/20    PT Start Time 0842    PT Stop Time 0926    PT Time Calculation (min) 44 min    Activity Tolerance Patient tolerated treatment well    Behavior During Therapy [facility] for tasks assessed/performed             No past medical history on file.  Past Surgical History:  Procedure Laterality Date   LITHOTRIPSY Left     There were no vitals filed for this visit.   Subjective Assessment - 11/09/20 0844     Subjective Pt reports he felt tight across his pecs after needling but more mobility in Rt shoulder. States he will be out of town next week    Currently in Pain? No/denies                [facility] PT Assessment - 11/09/20 0001       Assessment   Medical Diagnosis Cervical dysfunction    Referring Provider (PT) Dr Dianah Field    Onset Date/Surgical Date 08/04/20    Hand Dominance Right    Next MD Visit 10/10    Prior Therapy yes in past for cervical radiculopathy; continued with yoga; chiropractic care      AROM   Cervical Extension 38    Cervical - Left Side Bend 18    Cervical - Left Rotation 40                           [facility] Adult PT Treatment/Exercise - 11/09/20 0001       Shoulder Exercises: Standing   External Rotation 10 reps    Theraband Level (Shoulder External Rotation) Level 2 (Red)    External Rotation Limitations bilat ER with back against foam roll    Diagonals Both;10 reps    Theraband Level (Shoulder Diagonals) Level 2 (Red)    Diagonals Limitations with back against foam roll       Shoulder Exercises: ROM/Strengthening   UBE (Upper Arm Bike) L4 x 4 min fwd/bkwd      Shoulder Exercises: Stretch   Other Shoulder Stretches doorway stretch 3 positions x 2 reps x 30 sec hold VC/TC to avoid wt through UE's      Manual Therapy   Manual therapy comments skilled palpation to assess response to DN and manual work    Joint Mobilization CPAs and UPAS thoracic spine T1-T8 grade 2-3    Soft tissue mobilization STM upper traps and levator              Trigger Point Dry Needling - 11/09/20 0001     Consent Given? Yes    Education Handout Provided Previously provided    Upper Trapezius Response Twitch reponse elicited                        PT Long Term Goals - 10/20/20 1734       PT LONG TERM GOAL #1   Title Decrease pain in the Rt  shoulder and UE by 75-100% allowing patient to perform all functional activities at prior level of function    Time 6    Period Weeks    Status New    Target Date 12/01/20      PT LONG TERM GOAL #2   Title Improve posture and alignment with patient to demonstrate improved upright posture with posterior shoulder girdle engaged    Time 6    Period Weeks    Status New    Target Date 12/01/20      PT LONG TERM GOAL #3   Title Increase cervical ROM in Lt rotation and Lt lateral flexion by 5-10 deg    Time 6    Period Weeks    Status New    Target Date 12/01/20      PT LONG TERM GOAL #4   Title Independent in HEP (including aquatic program as indicated)    Time 6    Period Weeks    Status New    Target Date 12/01/20      PT LONG TERM GOAL #5   Title Improve functional limitation score to 72    Time 6    Period Weeks    Status New    Target Date 12/01/20                   Plan - 11/09/20 7867     Clinical Impression Statement Pt with improvement in cervical extension, no change in lateral flexion to Lt. Continues with increased mm spasticity in Rt traps and levator. Good response to dry needling     PT Next Visit Plan postural strength, manual work to improve scapular and thoracic mobility, dry needle    PT Home Exercise Plan [email or web address]    Consulted and Agree with Plan of Care Patient             Patient will benefit from skilled therapeutic intervention in order to improve the following deficits and impairments:     Visit Diagnosis: Cervicalgia  Acute pain of right shoulder  Abnormal posture  Other symptoms and signs involving the musculoskeletal system     Problem List Patient Active Problem List   Diagnosis Date Noted   DDD (degenerative disc disease), cervical 10/18/2020   Forearm strain, right, initial encounter 09/13/2020   Chronic pain of right wrist 09/13/2020   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

## 2020-11-11 ENCOUNTER — Ambulatory Visit (INDEPENDENT_AMBULATORY_CARE_PROVIDER_SITE_OTHER): Payer: Medicare Other | Admitting: Physical Therapy

## 2020-11-11 ENCOUNTER — Other Ambulatory Visit: Payer: Self-pay

## 2020-11-11 DIAGNOSIS — R293 Abnormal posture: Secondary | ICD-10-CM

## 2020-11-11 DIAGNOSIS — M25511 Pain in right shoulder: Secondary | ICD-10-CM | POA: Diagnosis not present

## 2020-11-11 DIAGNOSIS — R29898 Other symptoms and signs involving the musculoskeletal system: Secondary | ICD-10-CM

## 2020-11-11 DIAGNOSIS — M542 Cervicalgia: Secondary | ICD-10-CM

## 2020-11-11 NOTE — Therapy (Signed)
[place], [place], [address] Phone: [phone]   Fax:  [phone]  Physical Therapy Treatment  Patient Details  Name: Brett Garner MRN: [id number] Date of Birth: 1955-03-24 Referring Provider (PT): Dr Dianah Field   Encounter Date: 11/11/2020   PT End of Session - 11/11/20 1452     Visit Number 7    Number of Visits 12    Date for PT Re-Evaluation 12/01/20    PT Start Time 1400    PT Stop Time 1445    PT Time Calculation (min) 45 min    Activity Tolerance Patient tolerated treatment well    Behavior During Therapy [facility] for tasks assessed/performed             No past medical history on file.  Past Surgical History:  Procedure Laterality Date   LITHOTRIPSY Left     There were no vitals filed for this visit.   Subjective Assessment - 11/11/20 1405     Subjective Pt reports he felt "muscle soreness" after needling but is feeling better now    Currently in Pain? No/denies                [facility] PT Assessment - 11/11/20 0001       Assessment   Medical Diagnosis Cervical dysfunction    Referring Provider (PT) Dr Dianah Field    Onset Date/Surgical Date 08/04/20    Hand Dominance Right    Next MD Visit 10/10    Prior Therapy yes in past for cervical radiculopathy; continued with yoga; chiropractic care                           [facility] Adult PT Treatment/Exercise - 11/11/20 0001       Shoulder Exercises: Supine   Flexion Right;Left;10 reps    Flexion Limitations laying on foam roll    ABduction Left;Right;10 reps    ABduction Limitations laying on foam roll      Shoulder Exercises: Standing   External Rotation 15 reps    Theraband Level (Shoulder External Rotation) Level 2 (Red)    External Rotation Limitations bilat ER with back against foam roll    Diagonals Both;15 reps    Theraband Level (Shoulder Diagonals) Level 2 (Red)    Diagonals Limitations  with back against foam roll      Shoulder Exercises: ROM/Strengthening   UBE (Upper Arm Bike) L4 x 4 min fwd/bkwd      Manual Therapy   Joint Mobilization CPAs and UPAS thoracic spine T1-T8 grade 2-3    Soft tissue mobilization STM upper traps, levator, thoracic paraspinals bilat                          PT Long Term Goals - 10/20/20 1734       PT LONG TERM GOAL #1   Title Decrease pain in the Rt shoulder and UE by 75-100% allowing patient to perform all functional activities at prior level of function    Time 6    Period Weeks    Status New    Target Date 12/01/20      PT LONG TERM GOAL #2   Title Improve posture and alignment with patient to demonstrate improved upright posture with posterior shoulder girdle engaged    Time 6    Period Weeks    Status New    Target Date 12/01/20  PT LONG TERM GOAL #3   Title Increase cervical ROM in Lt rotation and Lt lateral flexion by 5-10 deg    Time 6    Period Weeks    Status New    Target Date 12/01/20      PT LONG TERM GOAL #4   Title Independent in HEP (including aquatic program as indicated)    Time 6    Period Weeks    Status New    Target Date 12/01/20      PT LONG TERM GOAL #5   Title Improve functional limitation score to 72    Time 6    Period Weeks    Status New    Target Date 12/01/20                   Plan - 11/11/20 1452     Clinical Impression Statement Pt continues with cervical tightness but is improving shoulder ROM and mobility. Pt with continued hypomobiliy in upper thoracic spine. Pt will benefit from cervical ROM and manual work to continue to progress    PT Next Visit Plan cervical mobs, axial extension, postural strength and manual as indicated    PT Home Exercise Plan [email or web address]    Consulted and Agree with Plan of Care Patient             Patient will benefit from skilled therapeutic intervention in order to improve the following deficits and impairments:      Visit Diagnosis: Cervicalgia  Acute pain of right shoulder  Abnormal posture  Other symptoms and signs involving the musculoskeletal system     Problem List Patient Active Problem List   Diagnosis Date Noted   DDD (degenerative disc disease), cervical 10/18/2020   Forearm strain, right, initial encounter 09/13/2020   Chronic pain of right wrist 09/13/2020   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

## 2020-11-23 ENCOUNTER — Other Ambulatory Visit: Payer: Self-pay

## 2020-11-23 ENCOUNTER — Ambulatory Visit (INDEPENDENT_AMBULATORY_CARE_PROVIDER_SITE_OTHER): Payer: Medicare Other | Admitting: Physical Therapy

## 2020-11-23 DIAGNOSIS — M25511 Pain in right shoulder: Secondary | ICD-10-CM | POA: Diagnosis not present

## 2020-11-23 DIAGNOSIS — M542 Cervicalgia: Secondary | ICD-10-CM | POA: Diagnosis present

## 2020-11-23 DIAGNOSIS — R29898 Other symptoms and signs involving the musculoskeletal system: Secondary | ICD-10-CM | POA: Diagnosis not present

## 2020-11-23 DIAGNOSIS — R293 Abnormal posture: Secondary | ICD-10-CM

## 2020-11-23 NOTE — Therapy (Signed)
[place], [place], [address] Phone: [phone]   Fax:  [phone]  Physical Therapy Treatment  Patient Details  Name: Brett Garner MRN: [id number] Date of Birth: 1955-12-19 Referring Provider (PT): Dr Dianah Field   Encounter Date: 11/23/2020   PT End of Session - 11/23/20 0901     Visit Number 8    Number of Visits 12    Date for PT Re-Evaluation 12/01/20    PT Start Time 0800    PT Stop Time 0845    PT Time Calculation (min) 45 min    Activity Tolerance Patient tolerated treatment well    Behavior During Therapy [facility] for tasks assessed/performed             No past medical history on file.  Past Surgical History:  Procedure Laterality Date   LITHOTRIPSY Left     There were no vitals filed for this visit.   Subjective Assessment - 11/23/20 0804     Subjective Pt states he wants to focus on his neck today. He tried to perform HEP on his trip    Currently in Pain? No/denies                [facility] PT Assessment - 11/23/20 0001       Assessment   Medical Diagnosis Cervical dysfunction    Referring Provider (PT) Dr Dianah Field    Onset Date/Surgical Date 08/04/20    Hand Dominance Right    Next MD Visit 10/10    Prior Therapy yes in past for cervical radiculopathy; continued with yoga; chiropractic care      AROM   Cervical - Right Side Bend 35    Cervical - Left Side Bend 18                           [facility] Adult PT Treatment/Exercise - 11/23/20 0001       Shoulder Exercises: Standing   External Rotation 15 reps    Theraband Level (Shoulder External Rotation) Level 2 (Red)    External Rotation Limitations bilat ER with back against foam roll    Diagonals Both;15 reps    Theraband Level (Shoulder Diagonals) Level 2 (Red)    Diagonals Limitations with back against foam roll      Shoulder Exercises: ROM/Strengthening   UBE (Upper Arm Bike) L4 x 4  mins fwd/bkwd      Shoulder Exercises: Stretch   Other Shoulder Stretches upper thoracic stretch kneeling with elbows on table      Manual Therapy   Manual therapy comments skilled palpation to assess effects of dry needling    Joint Mobilization CPAs and UPAS thoracic spine T1-T8 grade 2-3    Soft tissue mobilization STM upper traps, levator, thoracic paraspinals bilat              Trigger Point Dry Needling - 11/23/20 0001     Consent Given? Yes    Education Handout Provided Previously provided    Muscles Treated Head and Neck Cervical multifidi    Upper Trapezius Response Twitch reponse elicited    Cervical multifidi Response Palpable increased muscle length                   PT Education - 11/23/20 0901     Education Details upper thoracic stretch    Person(s) Educated Patient    Methods Explanation;Demonstration;Handout    Comprehension Verbalized understanding;Returned demonstration  PT Long Term Goals - 10/20/20 1734       PT LONG TERM GOAL #1   Title Decrease pain in the Rt shoulder and UE by 75-100% allowing patient to perform all functional activities at prior level of function    Time 6    Period Weeks    Status New    Target Date 12/01/20      PT LONG TERM GOAL #2   Title Improve posture and alignment with patient to demonstrate improved upright posture with posterior shoulder girdle engaged    Time 6    Period Weeks    Status New    Target Date 12/01/20      PT LONG TERM GOAL #3   Title Increase cervical ROM in Lt rotation and Lt lateral flexion by 5-10 deg    Time 6    Period Weeks    Status New    Target Date 12/01/20      PT LONG TERM GOAL #4   Title Independent in HEP (including aquatic program as indicated)    Time 6    Period Weeks    Status New    Target Date 12/01/20      PT LONG TERM GOAL #5   Title Improve functional limitation score to 72    Time 6    Period Weeks    Status New    Target Date  12/01/20                   Plan - 11/23/20 0902     Clinical Impression Statement Pt with improving cervical ROM, still limited in Lt sidebending due to Rt upper trap tightness. Pt with continued hypomobiliy in thoracic spine, added kneeling upper thoracic stretch. decreasing frequency to 1x a week with emphasis on HEP    PT Next Visit Plan cervical ROM, dry needling as indicated, stretching and strength, RECERT    PT Home Exercise Plan [email or web address]    Consulted and Agree with Plan of Care Patient             Patient will benefit from skilled therapeutic intervention in order to improve the following deficits and impairments:     Visit Diagnosis: Cervicalgia  Acute pain of right shoulder  Abnormal posture  Other symptoms and signs involving the musculoskeletal system     Problem List Patient Active Problem List   Diagnosis Date Noted   DDD (degenerative disc disease), cervical 10/18/2020   Forearm strain, right, initial encounter 09/13/2020   Chronic pain of right wrist 09/13/2020   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

## 2020-11-23 NOTE — Patient Instructions (Signed)
Access Code: [email or web address] URL: [email or web address] Date: 11/23/2020 Prepared by: Isabelle Course  Exercises Shoulder External Rotation and Scapular Retraction - 3 x daily - 7 x weekly - 1 sets - 10 reps - 3 hold Shoulder External Rotation in 45 Degrees Abduction - 2 x daily - 7 x weekly - 1-2 sets - 10 reps - 3 sec hold Sidelying Open Book Thoracic Lumbar Rotation and Extension - 1 x daily - 7 x weekly - 1 sets - 10 reps - 3 sec hold Supine PNF D2 - 1 x daily - 7 x weekly - 2 sets - 10 reps Drawing Bow - 1 x daily - 7 x weekly - 2 sets - 10 reps Doorway Pec Stretch at 60 Degrees Abduction - 3 x daily - 7 x weekly - 3 reps - 1 sets Doorway Pec Stretch at 90 Degrees Abduction - 3 x daily - 7 x weekly - 3 reps - 1 sets - 30 seconds hold Doorway Pec Stretch at 120 Degrees Abduction - 3 x daily - 7 x weekly - 3 reps - 1 sets - 30 second hold hold Prone Scapular Retraction - 2 x daily - 7 x weekly - 1 sets - 5-10 reps - 3-5 sec hold Supine Piriformis Stretch with Leg Straight - 2 x daily - 7 x weekly - 1 sets - 3 reps - 30 sec hold Kneeling Thoracic Extension Stretch with Swiss Ball - 1 x daily - 7 x weekly - 3 sets - 10 reps

## 2020-11-26 ENCOUNTER — Encounter: Payer: Medicare Other | Admitting: Physical Therapy

## 2020-11-29 ENCOUNTER — Ambulatory Visit (INDEPENDENT_AMBULATORY_CARE_PROVIDER_SITE_OTHER): Payer: Medicare Other | Admitting: Sports Medicine

## 2020-11-29 DIAGNOSIS — M5030 Other cervical disc degeneration, unspecified cervical region: Secondary | ICD-10-CM | POA: Diagnosis not present

## 2020-11-29 NOTE — Assessment & Plan Note (Signed)
Improved considerably with formal physical therapy, adding additional PT per patient request, I do not think we will need to proceed with MRI or epidurals as he is already feeling significantly better.

## 2020-11-29 NOTE — Progress Notes (Signed)
    Procedures performed today:    None.  Independent interpretation of notes and tests performed by another provider:   None.  Brief History, Exam, Impression, and Recommendations:    DDD (degenerative disc disease), cervical Improved considerably with formal physical therapy, adding additional PT per patient request, I do not think we will need to proceed with MRI or epidurals as he is already feeling significantly better.    ___________________________________________ Gwen Her. Dianah Field, M.D., ABFM., CAQSM. Primary Care and [place] Instructor of [place] [facility]

## 2020-12-01 ENCOUNTER — Ambulatory Visit (INDEPENDENT_AMBULATORY_CARE_PROVIDER_SITE_OTHER): Payer: Medicare Other | Admitting: Rehabilitative and Restorative Service Providers"

## 2020-12-01 ENCOUNTER — Encounter: Payer: Self-pay | Admitting: Rehabilitative and Restorative Service Providers"

## 2020-12-01 ENCOUNTER — Other Ambulatory Visit: Payer: Self-pay

## 2020-12-01 DIAGNOSIS — R29898 Other symptoms and signs involving the musculoskeletal system: Secondary | ICD-10-CM | POA: Diagnosis not present

## 2020-12-01 DIAGNOSIS — M542 Cervicalgia: Secondary | ICD-10-CM

## 2020-12-01 DIAGNOSIS — M25511 Pain in right shoulder: Secondary | ICD-10-CM

## 2020-12-01 DIAGNOSIS — R293 Abnormal posture: Secondary | ICD-10-CM

## 2020-12-01 NOTE — Therapy (Signed)
[place], [place], [address] Phone: [phone]   Fax:  [phone]  Physical Therapy Treatment  Patient Details  Name: Brett Garner MRN: [id number] Date of Birth: January 16, 1956 Referring Provider (PT): Dr Dianah Field   Encounter Date: 12/01/2020   PT End of Session - 12/01/20 0847     Visit Number 9    Number of Visits 20    Date for PT Re-Evaluation 01/12/21    PT Start Time 0845    PT Stop Time 0933    PT Time Calculation (min) 48 min    Activity Tolerance Patient tolerated treatment well             History reviewed. No pertinent past medical history.  Past Surgical History:  Procedure Laterality Date   LITHOTRIPSY Left     There were no vitals filed for this visit.   Subjective Assessment - 12/01/20 0849     Subjective Patient reports that he say MD Monday and Dr T is pleased with his progress with neck. He will continue PT for a few additional visits. Kwame is working on his stretching program at home. He will be casted for Rt wrist 02/22/21 for 6 weeks to address wrist injury he sustained ~ 2 years ago and symptoms have continued since that time.    Currently in Pain? No/denies    Pain Score 0-No pain                [facility] PT Assessment - 12/01/20 0001       Assessment   Medical Diagnosis Cervical dysfunction    Referring Provider (PT) Dr Dianah Field    Onset Date/Surgical Date 08/04/20    Hand Dominance Right    Next MD Visit 10/10    Prior Therapy yes in past for cervical radiculopathy; continued with yoga; chiropractic care      AROM   Cervical Flexion 55    Cervical Extension 44    Cervical - Right Side Bend 28    Cervical - Left Side Bend 18    Cervical - Right Rotation 57    Cervical - Left Rotation 51      Palpation   Spinal mobility hypomobile thoracic and cervical spine with PA and lateral mobs                           [facility] Adult PT  Treatment/Exercise - 12/01/20 0001       Shoulder Exercises: Supine   Other Supine Exercises trunk rotation lying on coregeous ball T-spine      Shoulder Exercises: Seated   Other Seated Exercises thoracic extension w/ coregeous ball at thoracic spine - PT assist for thoracic extension 10-15 sec hold x 3 reps repeated x 3 with hands behind head    Other Seated Exercises thoracic rotation reaching opposite hand to back of chair 10-15 sec hold x 3 reps repeated with UE extended overhead x 3      Shoulder Exercises: Standing   Other Standing Exercises V at wall x 15      Shoulder Exercises: ROM/Strengthening   UBE (Upper Arm Bike) L5 x 4 mins fwd/bkwd    "W" Arms 10      Manual Therapy   Joint Mobilization CPAs and UPAS thoracic spine T1-T8 grade 2-3    Other Manual Therapy pec release seated puling patient to improved upright posture  PT Education - 12/01/20 0930     Education Details HEP    Person(s) Educated Patient    Methods Explanation;Demonstration;Tactile cues;Verbal cues;Handout    Comprehension Verbalized understanding;Returned demonstration;Verbal cues required;Tactile cues required                 PT Long Term Goals - 12/01/20 0948       PT LONG TERM GOAL #1   Title Decrease pain in the Rt shoulder and UE by 75-100% allowing patient to perform all functional activities at prior level of function    Time 6    Period Weeks    Status Partially Met    Target Date 01/12/21      PT LONG TERM GOAL #2   Title Improve posture and alignment with patient to demonstrate improved upright posture with posterior shoulder girdle engaged    Time 6    Period Weeks    Status Partially Met    Target Date 01/12/21      PT LONG TERM GOAL #3   Title Increase cervical ROM in Lt rotation and Lt lateral flexion by 5-10 deg    Time 6    Period Weeks    Status On-going    Target Date 01/12/21      PT LONG TERM GOAL #4   Title Independent in  HEP (including aquatic program as indicated)    Time 6    Period Weeks    Status On-going    Target Date 01/12/21      PT LONG TERM GOAL #5   Title Improve functional limitation score to 72    Time 6    Period Weeks    Status On-going    Target Date 01/12/21                   Plan - 12/01/20 0912     Clinical Impression Statement Gradual improvement in cervical and thoracic mobility/ROM. Patient is added thoracic rotation and mobilization exercises without difficulty. Progressing well toward goals of therapy. Will benefit from continued PT to address remaining problems and reach maximum rehab potential.    Rehab Potential Good    PT Frequency 2x / week    PT Duration 6 weeks    PT Treatment/Interventions ADLs/Self Care Home Management;Aquatic Therapy;Cryotherapy;Electrical Stimulation;Iontophoresis 30m/ml Dexamethasone;Moist Heat;Ultrasound;Therapeutic activities;Therapeutic exercise;Neuromuscular re-education;Patient/family education;Manual techniques;Dry needling;Taping;Passive range of motion;Vasopneumatic Device    PT Next Visit Plan cervical ROM, dry needling as indicated, stretching and strength - 10Th visit medicare note    PT Home Exercise Plan [email or web address]   Consulted and Agree with Plan of Care Patient             Patient will benefit from skilled therapeutic intervention in order to improve the following deficits and impairments:     Visit Diagnosis: Cervicalgia - Plan: PT plan of care cert/re-cert  Acute pain of right shoulder - Plan: PT plan of care cert/re-cert  Abnormal posture - Plan: PT plan of care cert/re-cert  Other symptoms and signs involving the musculoskeletal system - Plan: PT plan of care cert/re-cert     Problem List Patient Active Problem List   Diagnosis Date Noted   DDD (degenerative disc disease), cervical 10/18/2020   Forearm strain, right, initial encounter 09/13/2020   Chronic pain of right wrist 09/13/2020   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

## 2020-12-01 NOTE — Patient Instructions (Addendum)
  Access Code: [email or web address] URL: [email or web address] Date: 12/01/2020 Prepared by: Gillermo Murdoch  Exercises Shoulder External Rotation and Scapular Retraction - 3 x daily - 7 x weekly - 1 sets - 10 reps - 3 hold Shoulder External Rotation in 45 Degrees Abduction - 2 x daily - 7 x weekly - 1-2 sets - 10 reps - 3 sec hold Sidelying Open Book Thoracic Lumbar Rotation and Extension - 1 x daily - 7 x weekly - 1 sets - 10 reps - 3 sec hold Supine PNF D2 - 1 x daily - 7 x weekly - 2 sets - 10 reps Drawing Bow - 1 x daily - 7 x weekly - 2 sets - 10 reps Doorway Pec Stretch at 60 Degrees Abduction - 3 x daily - 7 x weekly - 3 reps - 1 sets Doorway Pec Stretch at 90 Degrees Abduction - 3 x daily - 7 x weekly - 3 reps - 1 sets - 30 seconds hold Doorway Pec Stretch at 120 Degrees Abduction - 3 x daily - 7 x weekly - 3 reps - 1 sets - 30 second hold hold Prone Scapular Retraction - 2 x daily - 7 x weekly - 1 sets - 5-10 reps - 3-5 sec hold Supine Piriformis Stretch with Leg Straight - 2 x daily - 7 x weekly - 1 sets - 3 reps - 30 sec hold Kneeling Thoracic Extension Stretch with Swiss Ball - 1 x daily - 7 x weekly - 3 sets - 10 reps Seated Thoracic Extension and Rotation with Reach - 2 x daily - 7 x weekly - 1 sets - 3 reps - 10 sec hold Supine Lower Trunk Rotation - 2 x daily - 7 x weekly - 1 sets - 3-5 reps - 20-30 sec hold Standing shoulder flexion wall slides - 2 x daily - 7 x weekly - 1 sets - 10 reps - 3 sec hold

## 2020-12-07 ENCOUNTER — Ambulatory Visit (INDEPENDENT_AMBULATORY_CARE_PROVIDER_SITE_OTHER): Payer: Medicare Other | Admitting: Physical Therapy

## 2020-12-07 ENCOUNTER — Other Ambulatory Visit: Payer: Self-pay

## 2020-12-07 DIAGNOSIS — R293 Abnormal posture: Secondary | ICD-10-CM

## 2020-12-07 DIAGNOSIS — R29898 Other symptoms and signs involving the musculoskeletal system: Secondary | ICD-10-CM

## 2020-12-07 DIAGNOSIS — M25511 Pain in right shoulder: Secondary | ICD-10-CM

## 2020-12-07 DIAGNOSIS — M542 Cervicalgia: Secondary | ICD-10-CM

## 2020-12-07 NOTE — Therapy (Signed)
[place], [place], [address] Phone: [phone]   Fax:  [phone]  Physical Therapy Treatment and 10th visit medicare note  Patient Details  Name: Brett Garner MRN: [id number] Date of Birth: 07/31/1955 Referring Provider (PT): Dr Dianah Field   Encounter Date: 12/07/2020 Dates of service 10/20/20-12/07/20   PT End of Session - 12/07/20 0947     Visit Number 10    Number of Visits 20    Date for PT Re-Evaluation 01/12/21    Progress Note Due on Visit 20    PT Start Time 0801    PT Stop Time 0845    PT Time Calculation (min) 44 min    Activity Tolerance Patient tolerated treatment well    Behavior During Therapy [facility] for tasks assessed/performed             No past medical history on file.  Past Surgical History:  Procedure Laterality Date   LITHOTRIPSY Left     There were no vitals filed for this visit.   Subjective Assessment - 12/07/20 0805     Subjective Pt with more tightness in Rt upper trap today. States he has been doing exercises with the ball and thoracic twisting and that has helped    Currently in Pain? No/denies                [facility] PT Assessment - 12/07/20 0001       Assessment   Medical Diagnosis Cervical dysfunction    Referring Provider (PT) Dr Dianah Field    Onset Date/Surgical Date 08/04/20    Hand Dominance Right    Prior Therapy yes in past for cervical radiculopathy; continued with yoga; chiropractic care      Palpation   Palpation comment increased mm spasticity Rt upper trap and levator                           [facility] Adult PT Treatment/Exercise - 12/07/20 0001       Shoulder Exercises: Seated   Other Seated Exercises thoracic extension with coregeous ball behind spine and UEs raised    Other Seated Exercises thoracic rotation reaching opposite hand to back of chair 10-15 sec hold x 3 reps repeated with UE extended overhead  x 3      Shoulder Exercises: Prone   Other Prone Exercises T, Y, Rt UE x 10 each      Shoulder Exercises: ROM/Strengthening   UBE (Upper Arm Bike) L5 x 4 mins fwd/bkwd      Manual Therapy   Manual therapy comments skiled palpation to assess effects of dry needling    Joint Mobilization CPAs and UPAS thoracic spine T1-T8 grade 2-3              Trigger Point Dry Needling - 12/07/20 0001     Consent Given? Yes    Education Handout Provided Previously provided    Upper Trapezius Response Twitch reponse elicited                        PT Long Term Goals - 12/01/20 0948       PT LONG TERM GOAL #1   Title Decrease pain in the Rt shoulder and UE by 75-100% allowing patient to perform all functional activities at prior level of function    Time 6    Period Weeks    Status Partially Met  Target Date 01/12/21      PT LONG TERM GOAL #2   Title Improve posture and alignment with patient to demonstrate improved upright posture with posterior shoulder girdle engaged    Time 6    Period Weeks    Status Partially Met    Target Date 01/12/21      PT LONG TERM GOAL #3   Title Increase cervical ROM in Lt rotation and Lt lateral flexion by 5-10 deg    Time 6    Period Weeks    Status On-going    Target Date 01/12/21      PT LONG TERM GOAL #4   Title Independent in HEP (including aquatic program as indicated)    Time 6    Period Weeks    Status On-going    Target Date 01/12/21      PT LONG TERM GOAL #5   Title Improve functional limitation score to 72    Time 6    Period Weeks    Status On-going    Target Date 01/12/21                   Plan - 12/07/20 1003     Clinical Impression Statement Pt continues to respond well to manual therapy and dry needline. Compliant with HEP for stretching and strengthening. He will continue to beneift from skilled PT to address deficits and improve mobility and decrease pain    PT Next Visit Plan cervical ROM,  thoracic strengthening and stretching    PT Home Exercise Plan [email or web address]    Consulted and Agree with Plan of Care Patient             Patient will benefit from skilled therapeutic intervention in order to improve the following deficits and impairments:     Visit Diagnosis: Cervicalgia  Acute pain of right shoulder  Abnormal posture  Other symptoms and signs involving the musculoskeletal system     Problem List Patient Active Problem List   Diagnosis Date Noted   DDD (degenerative disc disease), cervical 10/18/2020   Forearm strain, right, initial encounter 09/13/2020   Chronic pain of right wrist 09/13/2020   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

## 2020-12-13 ENCOUNTER — Other Ambulatory Visit: Payer: Self-pay

## 2020-12-13 ENCOUNTER — Ambulatory Visit (INDEPENDENT_AMBULATORY_CARE_PROVIDER_SITE_OTHER): Payer: Medicare Other | Admitting: Rehabilitative and Restorative Service Providers"

## 2020-12-13 ENCOUNTER — Encounter: Payer: Self-pay | Admitting: Rehabilitative and Restorative Service Providers"

## 2020-12-13 DIAGNOSIS — R29898 Other symptoms and signs involving the musculoskeletal system: Secondary | ICD-10-CM

## 2020-12-13 DIAGNOSIS — M25511 Pain in right shoulder: Secondary | ICD-10-CM | POA: Diagnosis not present

## 2020-12-13 DIAGNOSIS — R293 Abnormal posture: Secondary | ICD-10-CM

## 2020-12-13 DIAGNOSIS — M542 Cervicalgia: Secondary | ICD-10-CM

## 2020-12-13 NOTE — Patient Instructions (Signed)
Access Code: [email or web address] URL: [email or web address] Date: 12/13/2020 Prepared by: Gillermo Murdoch  Exercises Shoulder External Rotation and Scapular Retraction - 3 x daily - 7 x weekly - 1 sets - 10 reps - 3 hold Shoulder External Rotation in 45 Degrees Abduction - 2 x daily - 7 x weekly - 1-2 sets - 10 reps - 3 sec hold Sidelying Open Book Thoracic Lumbar Rotation and Extension - 1 x daily - 7 x weekly - 1 sets - 10 reps - 3 sec hold Supine PNF D2 - 1 x daily - 7 x weekly - 2 sets - 10 reps Drawing Bow - 1 x daily - 7 x weekly - 2 sets - 10 reps Doorway Pec Stretch at 60 Degrees Abduction - 3 x daily - 7 x weekly - 1 sets - 3 reps Doorway Pec Stretch at 90 Degrees Abduction - 3 x daily - 7 x weekly - 1 sets - 3 reps - 30 seconds hold Doorway Pec Stretch at 120 Degrees Abduction - 3 x daily - 7 x weekly - 1 sets - 3 reps - 30 second hold hold Prone Scapular Retraction - 2 x daily - 7 x weekly - 1 sets - 5-10 reps - 3-5 sec hold Supine Piriformis Stretch with Leg Straight - 2 x daily - 7 x weekly - 1 sets - 3 reps - 30 sec hold Kneeling Thoracic Extension Stretch with Swiss Ball - 1 x daily - 7 x weekly - 3 sets - 10 reps Seated Thoracic Extension and Rotation with Reach - 2 x daily - 7 x weekly - 1 sets - 3 reps - 10 sec hold Supine Lower Trunk Rotation - 2 x daily - 7 x weekly - 1 sets - 3-5 reps - 20-30 sec hold Standing shoulder flexion wall slides - 2 x daily - 7 x weekly - 1 sets - 10 reps - 3 sec hold Hip Flexor Stretch at Edge of Bed - 2 x daily - 7 x weekly - 1 sets - 3 reps - 30 sec hold Seated Cervical Sidebending Stretch - 2 x daily - 7 x weekly - 1 sets - 3-5 reps - 5-10 sec hold Plank on Counter - 2 x daily - 7 x weekly - 1 sets - 3 reps - 30-60 sec hold

## 2020-12-13 NOTE — Therapy (Signed)
[place], [place], [address] Phone: [phone]   Fax:  [phone]  Physical Therapy Treatment  Patient Details  Name: Brett Garner MRN: [id number] Date of Birth: 01-Oct-1955 Referring Provider (PT): Dr Dianah Field   Encounter Date: 12/13/2020   PT End of Session - 12/13/20 0801     Visit Number 11    Number of Visits 20    Date for PT Re-Evaluation 01/12/21    Progress Note Due on Visit 20    PT Start Time 0800    PT Stop Time 0845    PT Time Calculation (min) 45 min    Activity Tolerance Patient tolerated treatment well             History reviewed. No pertinent past medical history.  Past Surgical History:  Procedure Laterality Date   LITHOTRIPSY Left     There were no vitals filed for this visit.   Subjective Assessment - 12/13/20 0802     Subjective Rt shoulder blade area is sore. 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 Takes over an hour in the morning to go through all his exercises but he has been doing some of the yoga stretches from his neighbor    Currently in Pain? No/denies    Pain Score 0-No pain                [facility] PT Assessment - 12/13/20 0001       Assessment   Medical Diagnosis Cervical dysfunction    Referring Provider (PT) Dr Dianah Field    Onset Date/Surgical Date 08/04/20    Hand Dominance Right    Prior Therapy yes in past for cervical radiculopathy; continued with yoga; chiropractic care      Palpation   Spinal mobility hypomobile thoracic and cervical spine with PA and lateral mobs    SI assessment  tightness noted Rt hip flexors/iliopsoas musculature                           [facility] Adult PT Treatment/Exercise - 12/13/20 0001       Neuro Re-ed    Neuro Re-ed Details  working on postural correction encouraging pt to engage posterior shoulder girdle musculature; tuck chin and reach for floor with Lt LE      Shoulder Exercises: Supine    Other Supine Exercises trunk rotation lying on coregeous ball T-spine      Shoulder Exercises: Standing   Other Standing Exercises lateral cervical flexion to Lt assisting with Lt UE to stretch Rt lateral cervical musculature repeated x 2 ~ 10-15 sec hold      Shoulder Exercises: ROM/Strengthening   UBE (Upper Arm Bike) L6 x 4 mins fwd/bkwd    "W" Arms 10      Shoulder Exercises: Stretch   Wall Stretch - Flexion Limitations hand over doorway for stretch into shoulder flexion for stretch 20-30 sec x 2; reaching to the Lt to increase stretch    Table Stretch - ABduction Limitations horizontal abduction supine arms in ~ 90 deg abduction supine coregeous ball mid thoracic    Star [place]azer Stretch --   lateral cervical flexion standing at wall assist with Lt UE stretching to the Lt 10-15 sec hold x 4 reps   Other Shoulder Stretches doorway stretch 3 positions x 2 reps x 30 sec hold VC/TC to avoid wt through UE's    Other Shoulder Stretches hip flexor stretch sitting reaching over with  Lt with Rt UE; repeated with Thomas stretch in supine with PT assist to stretch Rt hip flexors and quads                     PT Education - 12/13/20 0844     Education Details HEP    Person(s) Educated Patient    Methods Explanation;Demonstration;Tactile cues;Verbal cues;Handout    Comprehension Verbalized understanding;Returned demonstration;Verbal cues required;Tactile cues required                 PT Long Term Goals - 12/01/20 0948       PT LONG TERM GOAL #1   Title Decrease pain in the Rt shoulder and UE by 75-100% allowing patient to perform all functional activities at prior level of function    Time 6    Period Weeks    Status Partially Met    Target Date 01/12/21      PT LONG TERM GOAL #2   Title Improve posture and alignment with patient to demonstrate improved upright posture with posterior shoulder girdle engaged    Time 6    Period Weeks    Status Partially Met     Target Date 01/12/21      PT LONG TERM GOAL #3   Title Increase cervical ROM in Lt rotation and Lt lateral flexion by 5-10 deg    Time 6    Period Weeks    Status On-going    Target Date 01/12/21      PT LONG TERM GOAL #4   Title Independent in HEP (including aquatic program as indicated)    Time 6    Period Weeks    Status On-going    Target Date 01/12/21      PT LONG TERM GOAL #5   Title Improve functional limitation score to 72    Time 6    Period Weeks    Status On-going    Target Date 01/12/21                   Plan - 12/13/20 0806     Clinical Impression Statement Patient is working on exercises at home. Some continued tightness and discomfort in the neck muscle on the Rt side. Good improvement in pec stretch in higher position. Improving posture and alignment with less head tip to Rt. Can correct with visual cues/verbal cues.    Rehab Potential Good    PT Frequency 2x / week    PT Duration 6 weeks    PT Treatment/Interventions ADLs/Self Care Home Management;Aquatic Therapy;Cryotherapy;Electrical Stimulation;Iontophoresis 80m/ml Dexamethasone;Moist Heat;Ultrasound;Therapeutic activities;Therapeutic exercise;Neuromuscular re-education;Patient/family education;Manual techniques;Dry needling;Taping;Passive range of motion;Vasopneumatic Device    PT Next Visit Plan cervical ROM, thoracic strengthening and stretching    PT Home Exercise Plan [id number]   Consulted and Agree with Plan of Care Patient             Patient will benefit from skilled therapeutic intervention in order to improve the following deficits and impairments:     Visit Diagnosis: Cervicalgia  Acute pain of right shoulder  Abnormal posture  Other symptoms and signs involving the musculoskeletal system     Problem List Patient Active Problem List   Diagnosis Date Noted   DDD (degenerative disc disease), cervical 10/18/2020   Forearm strain, right, initial encounter 09/13/2020    Chronic pain of right wrist 09/13/2020   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, [place], [address] Phone: [phone]   Fax:  [phone]  Name: Brett Garner MRN: [id number] Date of Birth: 02-16-1956

## 2021-01-03 ENCOUNTER — Ambulatory Visit (INDEPENDENT_AMBULATORY_CARE_PROVIDER_SITE_OTHER): Payer: Medicare Other | Admitting: Rehabilitative and Restorative Service Providers"

## 2021-01-03 ENCOUNTER — Encounter: Payer: Self-pay | Admitting: Rehabilitative and Restorative Service Providers"

## 2021-01-03 ENCOUNTER — Other Ambulatory Visit: Payer: Self-pay

## 2021-01-03 DIAGNOSIS — M25511 Pain in right shoulder: Secondary | ICD-10-CM | POA: Diagnosis not present

## 2021-01-03 DIAGNOSIS — R29898 Other symptoms and signs involving the musculoskeletal system: Secondary | ICD-10-CM | POA: Diagnosis not present

## 2021-01-03 DIAGNOSIS — M542 Cervicalgia: Secondary | ICD-10-CM

## 2021-01-03 DIAGNOSIS — R293 Abnormal posture: Secondary | ICD-10-CM | POA: Diagnosis not present

## 2021-01-03 NOTE — Therapy (Signed)
[place], [place], [address] Phone: [phone]   Fax:  [phone]  Physical Therapy Treatment  Patient Details  Name: Brett Garner MRN: [id number] Date of Birth: September 11, 1955 Referring Provider (PT): Dr Dianah Field   Encounter Date: 01/03/2021   PT End of Session - 01/03/21 0754     Visit Number 12    Number of Visits 20    Date for PT Re-Evaluation 01/12/21    Progress Note Due on Visit 20    PT Start Time 0754    PT Stop Time 0839    PT Time Calculation (min) 45 min    Activity Tolerance Patient tolerated treatment well             History reviewed. No pertinent past medical history.  Past Surgical History:  Procedure Laterality Date   LITHOTRIPSY Left     There were no vitals filed for this visit.   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 Now has pain in the Rt elbow which is present when he drives.    Currently in Pain? No/denies    Pain Score 0-No pain                [facility] PT Assessment - 01/03/21 0001       Assessment   Medical Diagnosis Cervical dysfunction    Referring Provider (PT) Dr Dianah Field    Onset Date/Surgical Date 08/04/20    Hand Dominance Right    Prior Therapy yes in past for cervical radiculopathy; continued with yoga; chiropractic care      AROM   Cervical Flexion 57    Cervical Extension 52    Cervical - Right Side Bend 26    Cervical - Left Side Bend 22    Cervical - Right Rotation 58    Cervical - Left Rotation 56      Palpation   Spinal mobility hypomobile thoracic and cervical spine with PA and lateral mobs    Palpation comment muscular tightness Rt > Lt pecs; upper trap and levator                           [facility] Adult PT Treatment/Exercise - 01/03/21 0001       Self-Care    Self-Care Other Self-Care Comments    Other Self-Care Comments  worked with pt sitting in his car to modify vehicle      Therapeutic Activites    Therapeutic Activities Other Therapeutic Activities    Other Therapeutic Activities neural mobilization 1 min x 2 repe each side      Neuro Re-ed    Neuro Re-ed Details  working on postural correction encouraging pt to engage posterior shoulder girdle musculature; tuck chin and reach for floor with Lt LE      Shoulder Exercises: ROM/Strengthening   UBE (Upper Arm Bike) L8 x 4 mins fwd/bkwd    "W" Arms 10      Manual Therapy   Soft tissue mobilization STM upper traps, levator, thoracic paraspinals bilat    Other Manual Therapy neural mobiliization supine                          PT Long Term Goals - 12/01/20 6962  PT LONG TERM GOAL #1   Title Decrease pain in the Rt shoulder and UE by 75-100% allowing patient to perform all functional activities at prior level of function    Time 6    Period Weeks    Status Partially Met    Target Date 01/12/21      PT LONG TERM GOAL #2   Title Improve posture and alignment with patient to demonstrate improved upright posture with posterior shoulder girdle engaged    Time 6    Period Weeks    Status Partially Met    Target Date 01/12/21      PT LONG TERM GOAL #3   Title Increase cervical ROM in Lt rotation and Lt lateral flexion by 5-10 deg    Time 6    Period Weeks    Status On-going    Target Date 01/12/21      PT LONG TERM GOAL #4   Title Independent in HEP (including aquatic program as indicated)    Time 6    Period Weeks    Status On-going    Target Date 01/12/21      PT LONG TERM GOAL #5   Title Improve functional limitation score to 72    Time 6    Period Weeks    Status On-going    Target Date 01/12/21                   Plan - 01/03/21 0802     Clinical Impression Statement Patient is doing well except when he drives. He is having pain into  the Rt elbow area when he drives. Practiced sitting position in the car making modifications for seat position and adjustments with vehicle. Patient experienced less radicular elbow pain with modifications. Added neural mobilization median nerve UE's    Rehab Potential Good    PT Frequency 2x / week    PT Duration 6 weeks    PT Treatment/Interventions ADLs/Self Care Home Management;Aquatic Therapy;Cryotherapy;Electrical Stimulation;Iontophoresis 54m/ml Dexamethasone;Moist Heat;Ultrasound;Therapeutic activities;Therapeutic exercise;Neuromuscular re-education;Patient/family education;Manual techniques;Dry needling;Taping;Passive range of motion;Vasopneumatic Device    PT Next Visit Plan cervical ROM, thoracic strengthening and stretching    PT Home Exercise Plan [id number]   Consulted and Agree with Plan of Care Patient             Patient will benefit from skilled therapeutic intervention in order to improve the following deficits and impairments:     Visit Diagnosis: Cervicalgia  Acute pain of right shoulder  Abnormal posture  Other symptoms and signs involving the musculoskeletal system     Problem List Patient Active Problem List   Diagnosis Date Noted   DDD (degenerative disc disease), cervical 10/18/2020   Forearm strain, right, initial encounter 09/13/2020   Chronic pain of right wrist 09/13/2020   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

## 2021-01-10 ENCOUNTER — Encounter: Payer: Medicare Other | Admitting: Physical Therapy

## 2021-01-18 ENCOUNTER — Encounter: Payer: Medicare Other | Admitting: Physical Therapy

## 2021-01-19 ENCOUNTER — Other Ambulatory Visit: Payer: Self-pay

## 2021-01-19 ENCOUNTER — Ambulatory Visit (INDEPENDENT_AMBULATORY_CARE_PROVIDER_SITE_OTHER): Payer: Medicare Other | Admitting: Physical Therapy

## 2021-01-19 DIAGNOSIS — R29898 Other symptoms and signs involving the musculoskeletal system: Secondary | ICD-10-CM

## 2021-01-19 DIAGNOSIS — M542 Cervicalgia: Secondary | ICD-10-CM | POA: Diagnosis present

## 2021-01-19 DIAGNOSIS — R293 Abnormal posture: Secondary | ICD-10-CM | POA: Diagnosis not present

## 2021-01-19 DIAGNOSIS — M25511 Pain in right shoulder: Secondary | ICD-10-CM

## 2021-01-19 NOTE — Therapy (Signed)
[place], [place], [address] Phone: [phone]   Fax:  [phone]  Physical Therapy Treatment and Recertification  Patient Details  Name: Brett Garner MRN: [id number] Date of Birth: [birth year] Referring Provider (PT): Dr Dianah Field   Encounter Date: 01/19/2021   PT End of Session - 01/19/21 1423     Visit Number 13    Number of Visits 20    Date for PT Re-Evaluation 02/16/21    Progress Note Due on Visit 20    PT Start Time 1340    PT Stop Time 1420    PT Time Calculation (min) 40 min    Activity Tolerance Patient tolerated treatment well    Behavior During Therapy [facility] for tasks assessed/performed             No past medical history on file.  Past Surgical History:  Procedure Laterality Date   LITHOTRIPSY Left     There were no vitals filed for this visit.   Subjective Assessment - 01/19/21 1349     Subjective Pt states he has updated his driving set up and that he is feeling better during driving. He is now having pain in his Rt anterior and posterior shoulder joint that feels like muscle straining    Currently in Pain? Yes    Pain Score 2     Pain Location Shoulder    Pain Orientation Right    Pain Descriptors / Indicators Sore                [facility] PT Assessment - 01/19/21 0001       Assessment   Medical Diagnosis Cervical dysfunction    Referring Provider (PT) Dr Dianah Field    Onset Date/Surgical Date 08/04/20    Hand Dominance Right    Prior Therapy yes in past for cervical radiculopathy; continued with yoga; chiropractic care      Palpation   Spinal mobility hypomobile thoracic and cervical spine with PA and lateral mobs    Palpation comment mm tightness Rt pecs                           [facility] Adult PT Treatment/Exercise - 01/19/21 0001       Shoulder Exercises: ROM/Strengthening   UBE (Upper Arm Bike) L8 x 4 min fwd/bkwd    "W"  Arms 10      Shoulder Exercises: Stretch   Other Shoulder Stretches doorway stretch 3 positions 30sec x 2      Manual Therapy   Manual therapy comments skilled palpation to assess effects of dry needling    Joint Mobilization grade 2-3 CPAs and UPAs T1-T8    Soft tissue mobilization STM upper traps, levator, thoracic paraspinals bilat              Trigger Point Dry Needling - 01/19/21 0001     Consent Given? Yes    Education Handout Provided Previously provided    Upper Trapezius Response Twitch reponse elicited    Thoracic multifidi response Twitch response elicited;Palpable increased muscle length                        PT Long Term Goals - 01/19/21 1431       PT LONG TERM GOAL #1   Title Decrease pain in the Rt shoulder and UE by 75-100% allowing patient to perform all functional activities at prior level of  function    Status On-going    Target Date 02/16/21      PT LONG TERM GOAL #2   Title Improve posture and alignment with patient to demonstrate improved upright posture with posterior shoulder girdle engaged    Status On-going    Target Date 02/16/21      PT LONG TERM GOAL #3   Title Increase cervical ROM in Lt rotation and Lt lateral flexion by 5-10 deg    Status Achieved      PT LONG TERM GOAL #4   Title Independent in HEP (including aquatic program as indicated)    Status On-going    Target Date 02/16/21      PT LONG TERM GOAL #5   Title Improve functional limitation score to 72    Status On-going    Target Date 02/16/21                   Plan - 01/19/21 1427     Clinical Impression Statement Pt states he is having less pain with driving. he is working on his posture daily. Pt continues with hypomobility in thoracic and cervical spine. Session focused on manual work to improve mobility. pt will continue to benefit from skilled PT to improve cervical and thoracic ROM and decrease flare up of symptoms    PT Next Visit Plan  cervical ROM, thoracic strengthening and stretching    PT Home Exercise Plan [id number]    Consulted and Agree with Plan of Care Patient             Patient will benefit from skilled therapeutic intervention in order to improve the following deficits and impairments:  Decreased range of motion, Pain, Improper body mechanics, Hypomobility, Decreased mobility, Decreased strength, Postural dysfunction  Visit Diagnosis: Cervicalgia - Plan: PT plan of care cert/re-cert  Acute pain of right shoulder - Plan: PT plan of care cert/re-cert  Abnormal posture - Plan: PT plan of care cert/re-cert  Other symptoms and signs involving the musculoskeletal system - Plan: PT plan of care cert/re-cert     Problem List Patient Active Problem List   Diagnosis Date Noted   DDD (degenerative disc disease), cervical 10/18/2020   Forearm strain, right, initial encounter 09/13/2020   Chronic pain of right wrist 09/13/2020   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

## 2021-01-24 ENCOUNTER — Ambulatory Visit (INDEPENDENT_AMBULATORY_CARE_PROVIDER_SITE_OTHER): Payer: Medicare Other | Admitting: Rehabilitative and Restorative Service Providers"

## 2021-01-24 ENCOUNTER — Encounter: Payer: Self-pay | Admitting: Rehabilitative and Restorative Service Providers"

## 2021-01-24 ENCOUNTER — Other Ambulatory Visit: Payer: Self-pay

## 2021-01-24 DIAGNOSIS — M542 Cervicalgia: Secondary | ICD-10-CM

## 2021-01-24 DIAGNOSIS — M25511 Pain in right shoulder: Secondary | ICD-10-CM | POA: Diagnosis not present

## 2021-01-24 DIAGNOSIS — R29898 Other symptoms and signs involving the musculoskeletal system: Secondary | ICD-10-CM

## 2021-01-24 DIAGNOSIS — R293 Abnormal posture: Secondary | ICD-10-CM

## 2021-01-24 NOTE — Therapy (Signed)
[place], [place], [address] Phone: [phone]   Fax:  [phone]  Physical Therapy Treatment and Discharge Summary  PHYSICAL THERAPY DISCHARGE SUMMARY  Visits from Start of Care: 14  Current functional level related to goals / functional outcomes: See progress note   Remaining deficits: Continued postural changes with pt holding head to Rt - trunk shortened on Rt     Education / Equipment: HEP   Patient agrees to discharge. Patient goals were partially met. Patient is being discharged due to being pleased with the current functional level.  Remi Rester P. Helene Kelp PT, MPH 01/24/21 9:02 AM  Patient Details  Name: Brett Garner MRN: [id number] Date of Birth: 1956-01-05 Referring Provider (PT): Dr Dianah Field   Encounter Date: 01/24/2021   PT End of Session - 01/24/21 0800     Visit Number 14    Number of Visits 20    Date for PT Re-Evaluation 02/16/21    Progress Note Due on Visit 20    PT Start Time 0759    PT Stop Time 0845    PT Time Calculation (min) 46 min    Activity Tolerance Patient tolerated treatment well             History reviewed. No pertinent past medical history.  Past Surgical History:  Procedure Laterality Date   LITHOTRIPSY Left     There were no vitals filed for this visit.   Subjective Assessment - 01/24/21 0800     Subjective Patient reports that he has improved overall with less pain and discomfort in Rt neck and shoulder. Now feeling some tightness in the Rt lower back.    Currently in Pain? Yes    Pain Score 1     Pain Location Shoulder    Pain Orientation Right    Pain Descriptors / Indicators Sore;Tightness    Pain Type Chronic pain    Pain Onset More than a month ago    Pain Frequency Intermittent                [facility] PT Assessment - 01/24/21 0001       Assessment   Medical Diagnosis Cervical dysfunction    Referring Provider (PT) Dr  Dianah Field    Onset Date/Surgical Date 08/04/20    Hand Dominance Right    Prior Therapy yes in past for cervical radiculopathy; continued with yoga; chiropractic care      Observation/Other Assessments   Focus on Therapeutic Outcomes (FOTO)  77      AROM   Cervical Flexion 57    Cervical Extension 52    Cervical - Right Side Bend 26    Cervical - Left Side Bend 24    Cervical - Right Rotation 58    Cervical - Left Rotation 56      Palpation   Spinal mobility hypomobile thoracic and cervical spine with PA and lateral mobs    Palpation comment mm tightness Rt pecs               Assessment of leg length shows no significant difference with supine to sit and with measurement ASIS to medial malleolus and greater tuberosity of femur to medial malleolus             [facility] Adult PT Treatment/Exercise - 01/24/21 0001       Neuro Re-ed    Neuro Re-ed Details  working on postural correction encouraging pt to engage posterior shoulder girdle musculature;  tuck chin and reach for floor with Lt LE      Shoulder Exercises: Supine   Other Supine Exercises lat stretch in supine with PT assist for postioning and home program      Shoulder Exercises: Standing   Other Standing Exercises working on lateral trunk flexion to the Lt to elongate Rt side reaching overhead; standing in doorway for stability 5 reps with VC/TC of PT for technique    Other Standing Exercises lateral cervical flexion to Lt assisting with Lt UE to stretch Rt lateral cervical musculature repeated x 2 ~ 10-15 sec hold      Shoulder Exercises: ROM/Strengthening   UBE (Upper Arm Bike) L8 x 4 min fwd/bkwd    "W" Arms 10      Shoulder Exercises: Stretch   Other Shoulder Stretches doorway stretch 3 positions 30sec x 2 sloght turn to the Lt to increase stretch on Rt                          PT Long Term Goals - 01/24/21 0849       PT LONG TERM GOAL #1   Title Decrease pain in the Rt shoulder  and UE by 75-100% allowing patient to perform all functional activities at prior level of function    Time 6    Period Weeks    Status Achieved      PT LONG TERM GOAL #2   Title Improve posture and alignment with patient to demonstrate improved upright posture with posterior shoulder girdle engaged    Time 6    Period Weeks    Status Achieved      PT LONG TERM GOAL #3   Title Increase cervical ROM in Lt rotation and Lt lateral flexion by 5-10 deg    Time 6    Period Weeks    Status Partially Met      PT LONG TERM GOAL #4   Title Independent in HEP (including aquatic program as indicated)    Time 6    Period Weeks    Status Achieved      PT LONG TERM GOAL #5   Title Improve functional limitation score to 72    Time 6    Period Weeks    Status Achieved                    Patient will benefit from skilled therapeutic intervention in order to improve the following deficits and impairments:     Visit Diagnosis: Cervicalgia  Acute pain of right shoulder  Abnormal posture  Other symptoms and signs involving the musculoskeletal system     Problem List Patient Active Problem List   Diagnosis Date Noted   DDD (degenerative disc disease), cervical 10/18/2020   Forearm strain, right, initial encounter 09/13/2020   Chronic pain of right wrist 09/13/2020   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 [place], [place], [address] Phone: [phone]   Fax:  [phone]  Name: Brett Garner MRN: [id number] Date of Birth: 12-Jul-1955

## 2021-01-25 ENCOUNTER — Encounter: Payer: Medicare Other | Admitting: Physical Therapy

## 2021-02-01 ENCOUNTER — Encounter: Payer: Medicare Other | Admitting: Physical Therapy

## 2021-02-02 ENCOUNTER — Encounter: Payer: Medicare Other | Admitting: Physical Therapy

## 2021-02-07 ENCOUNTER — Encounter: Payer: Medicare Other | Admitting: Rehabilitative and Restorative Service Providers"

## 2021-02-08 ENCOUNTER — Encounter: Payer: Medicare Other | Admitting: Physical Therapy

## 2021-02-15 ENCOUNTER — Encounter: Payer: Medicare Other | Admitting: Physical Therapy

## 2021-02-16 ENCOUNTER — Encounter: Payer: Medicare Other | Admitting: Physical Therapy

## 2021-02-22 ENCOUNTER — Other Ambulatory Visit: Payer: Self-pay

## 2021-02-22 ENCOUNTER — Ambulatory Visit (INDEPENDENT_AMBULATORY_CARE_PROVIDER_SITE_OTHER): Payer: Medicare Other

## 2021-02-22 ENCOUNTER — Ambulatory Visit (INDEPENDENT_AMBULATORY_CARE_PROVIDER_SITE_OTHER): Payer: Medicare Other | Admitting: Sports Medicine

## 2021-02-22 DIAGNOSIS — M542 Cervicalgia: Secondary | ICD-10-CM

## 2021-02-22 DIAGNOSIS — G8929 Other chronic pain: Secondary | ICD-10-CM

## 2021-02-22 DIAGNOSIS — M5030 Other cervical disc degeneration, unspecified cervical region: Secondary | ICD-10-CM

## 2021-02-22 DIAGNOSIS — M25531 Pain in right wrist: Secondary | ICD-10-CM | POA: Diagnosis not present

## 2021-02-22 IMAGING — MR MR WRIST*R* W/O CM
6 series · 36 of 40 positions shown · non-contrast
Comparison: X-ray wrist [DATE].

CLINICAL DATA: Right medial ulnar wrist pain for 1 year related to
a lifting injury. Clinical concern for triangular fibrocartilage
complex tear versus extensor carpi ulnaris subluxation.

EXAM:
MR OF THE RIGHT WRIST WITHOUT CONTRAST
TECHNIQUE: Multiplanar, multisequence MR imaging of the right wrist was
performed. No intravenous contrast was administered.

[Series 3: T2 fat-sat · axial · 3.0mm · 0.39mm/px · z∈[-61,+19]mm · 9 of 24 slices shown (1 of 2)]
[im 1/24]
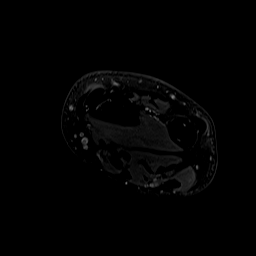
[im 3/24]
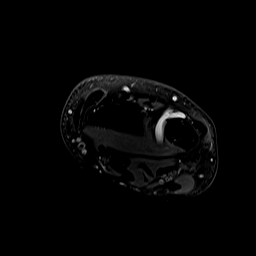
[im 6/24]
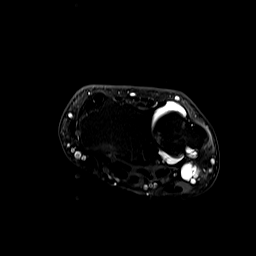
[im 9/24]
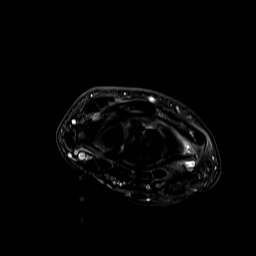
[im 12/24]
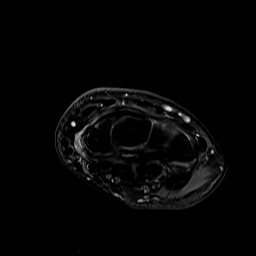
[im 15/24]
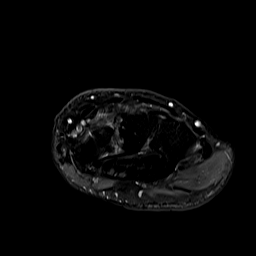
[im 18/24]
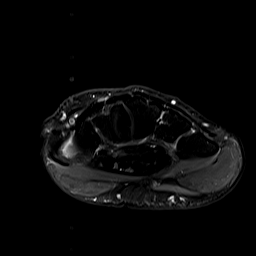
[im 21/24]
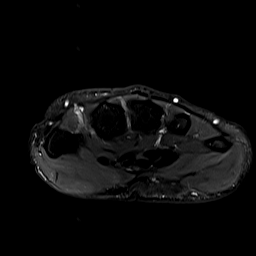
[im 24/24]
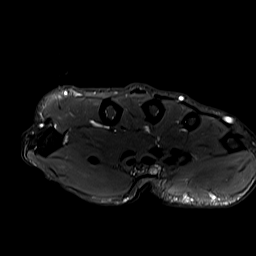

[Series 4: T1 · axial · 3.0mm · 0.39mm/px · z∈[-61,+20]mm · 8 of 24 slices shown (1 of 2)]
[im 1/24]
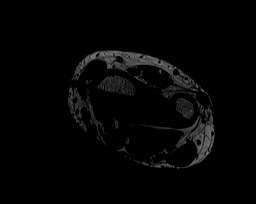
[im 4/24]
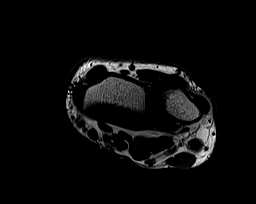
[im 7/24]
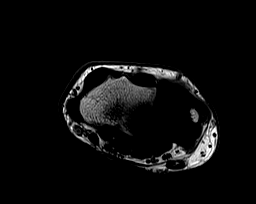
[im 10/24]
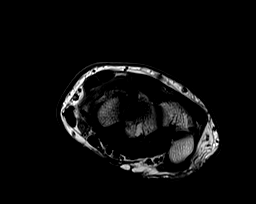
[im 14/24]
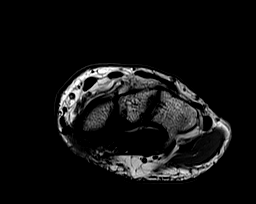
[im 17/24]
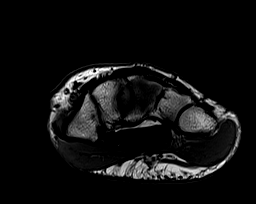
[im 20/24]
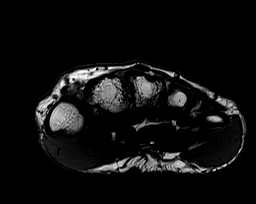
[im 24/24]
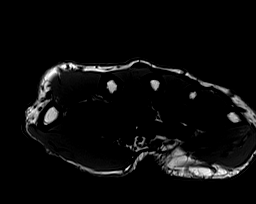

[Series 5: T1 · coronal · 3.0mm · 0.39mm/px · 1 of 16 slices shown (2 of 2)]
[im 1/16]
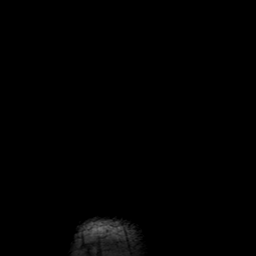

[Series 6: T2 fat-sat · coronal · 3.0mm · 0.39mm/px · 5 of 16 slices shown (2 of 2)]
[im 1/16]
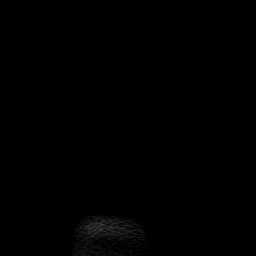
[im 4/16]
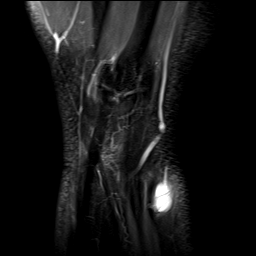
[im 8/16]
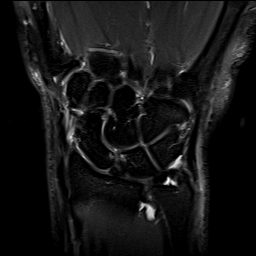
[im 12/16]
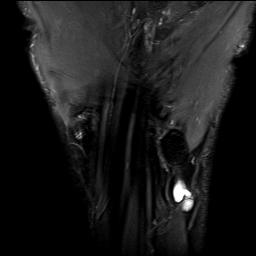
[im 16/16]
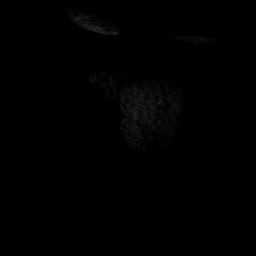

[Series 7: PD fat-sat · coronal · 3.0mm · 0.20mm/px · 5 of 16 slices shown (1 of 2)]
[im 1/16]
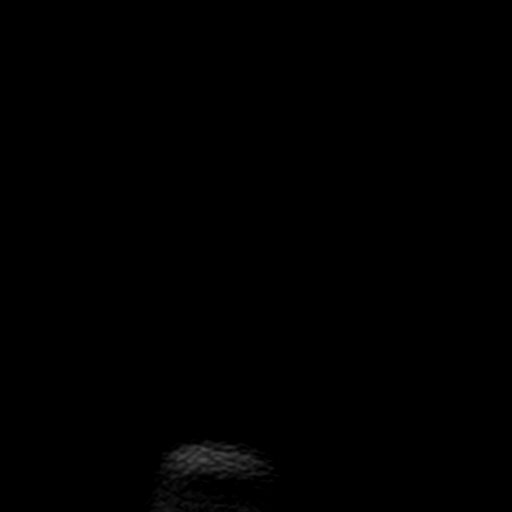
[im 4/16]
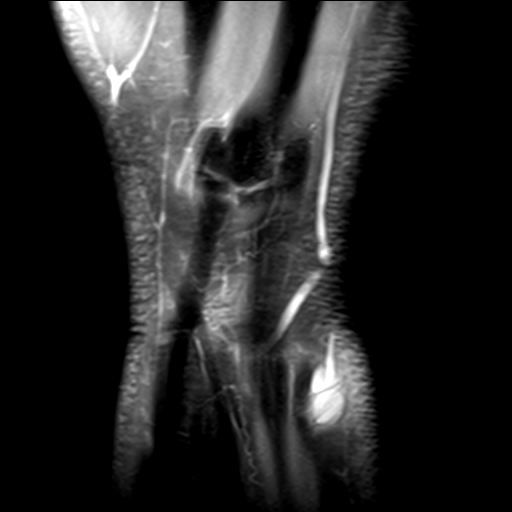
[im 8/16]
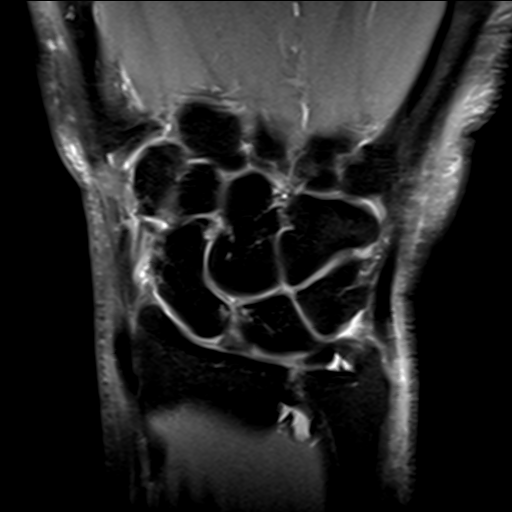
[im 12/16]
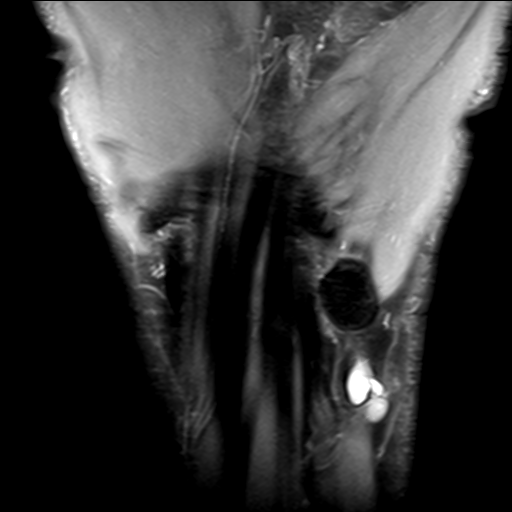
[im 16/16]
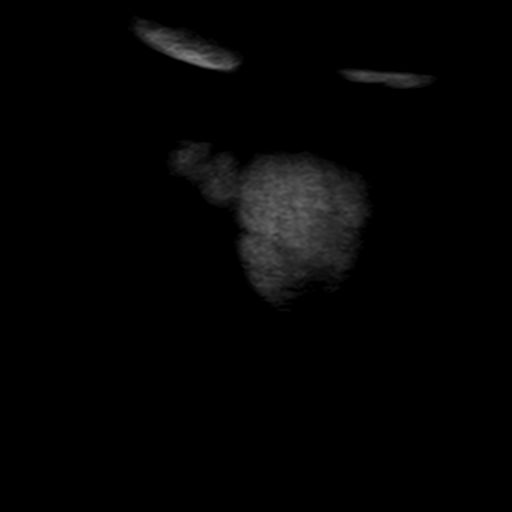

[Series 8: PD fat-sat · sagittal · 3.0mm · 0.20mm/px · 8 of 24 slices shown (2 of 2)]
[im 1/24]
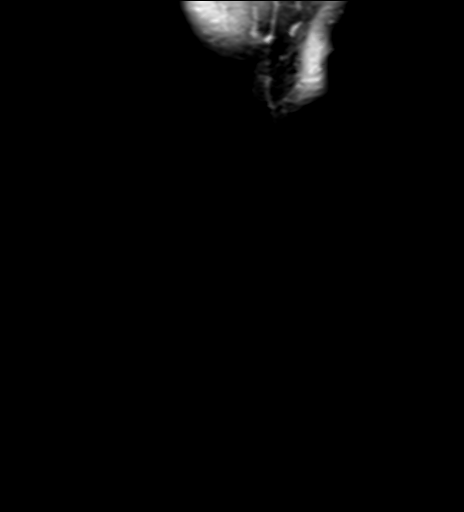
[im 4/24]
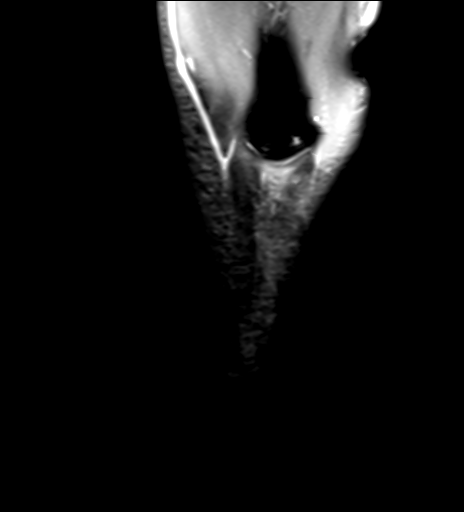
[im 7/24]
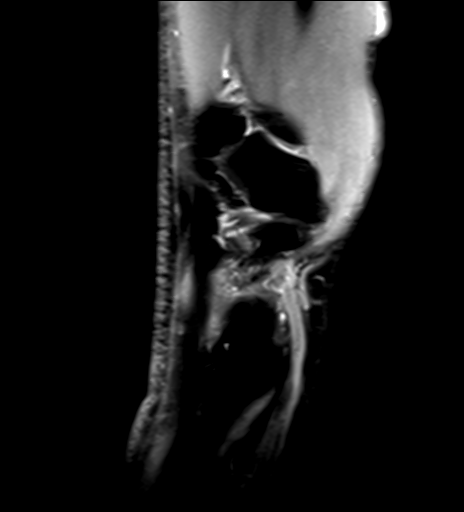
[im 10/24]
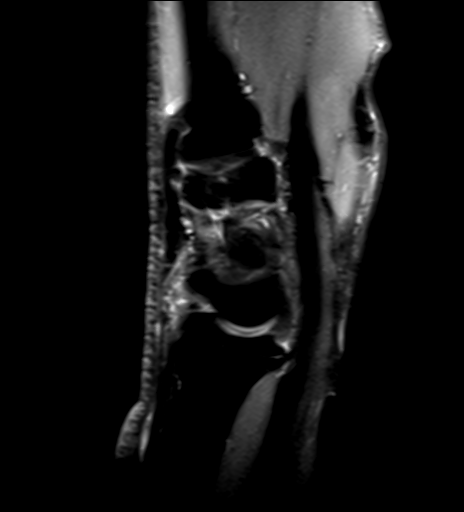
[im 14/24]
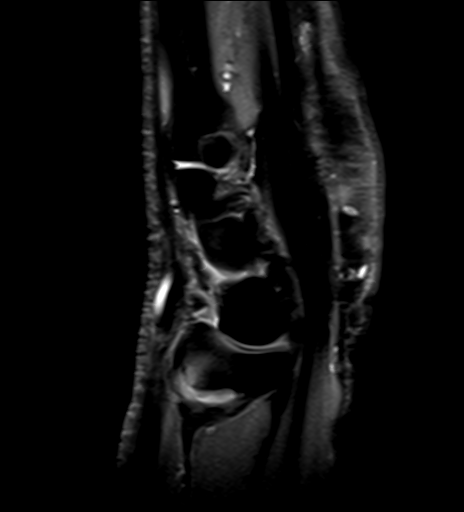
[im 17/24]
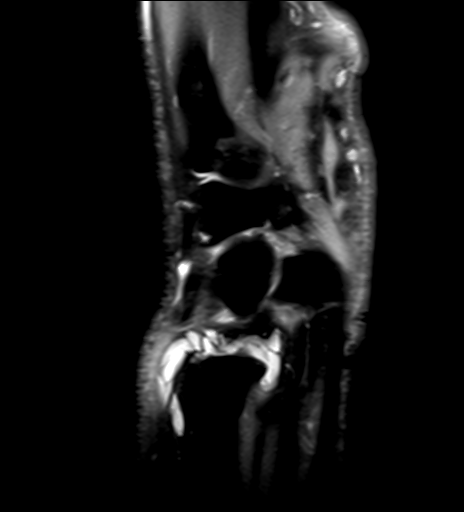
[im 20/24]
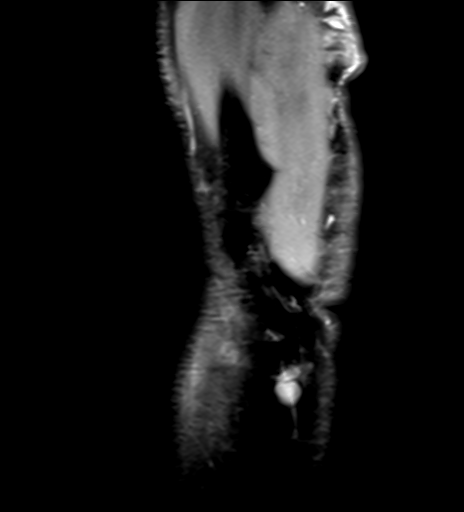
[im 24/24]
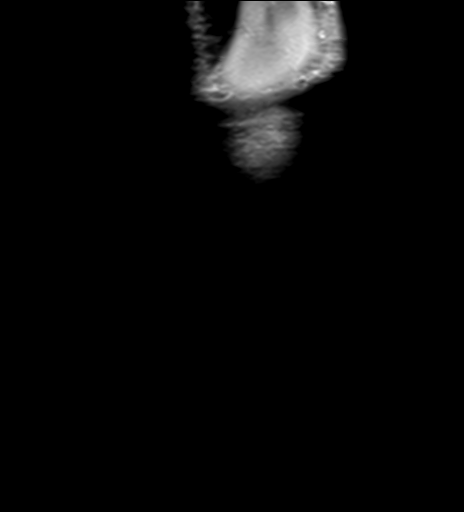

[36 of 40 positions shown; findings below may reference images not displayed]

FINDINGS: Ligaments: Scapholunate and lunotriquetral ligaments are intact.

Triangular fibrocartilage: The foveal attachment of the TFCC
articular disc appears diminutive. The styloid attachment is intact.
The articular disc is intact. The volar and dorsal radioulnar
ligaments are intact.

Tendons: Slight medial positioning of the extensor carpi ulnaris
tendon within the ulnar groove. Minimal adjacent edema signal. No
acute tendon tear or significant tenosynovitis of the extensor
tendons. Flexor tendons are unremarkable.

Carpal tunnel/median nerve: Flexor retinaculum is intact. Normal
carpal tunnel without a mass. Median nerve demonstrates normal
signal and caliber.

Guyon's canal: Normal Guyon's canal. Normal ulnar nerve.

Joint/cartilage: There is mild intercarpal arthritis with mild
partial-thickness cartilage loss, most prominent at the scapholunate
and lunate-scaphoid articulations.

Bones/carpal alignment: No fracture, avascular necrosis, or osseous
lesion. Normal alignment.

Other: There is joint effusion of the distal radioulnar joint, with
large intra-articular and periarticular ganglion cyst formation,
along both the dorsal and volar aspects (axial T2 images 17-21,
coronal images 6-12).
IMPRESSION: 1. DRUJ effusion with large intra-articular and periarticular
ganglion cyst formation.
2. Diminutive appearance of the TFCC foveal attachment, likely
representing partial tearing. Intact styloid attachment, articular
disc, and radioulnar ligaments.
3. Slight medial positioning of the extensor carpi ulnaris tendon
within the ulnar groove, suggestive of chronic ECU subsheath
stripping injury.
4. Mild intercarpal osteoarthritis.

## 2021-02-22 IMAGING — MR MR CERVICAL SPINE W/O CM
5 series · 41 of 48 positions shown · non-contrast
Comparison: None.

CLINICAL DATA: Neck pain, chronic, radiating into right arm

EXAM:
MRI CERVICAL SPINE WITHOUT CONTRAST
TECHNIQUE: Multiplanar, multisequence MR imaging of the cervical spine was
performed. No intravenous contrast was administered.

[Series 3: T2 · sagittal · 3.0mm · 0.86mm/px · 6 of 13 slices shown (1 of 2)]
[im 1/13]
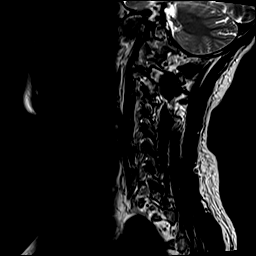
[im 3/13]
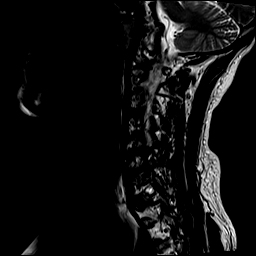
[im 5/13]
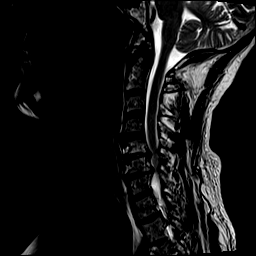
[im 8/13]
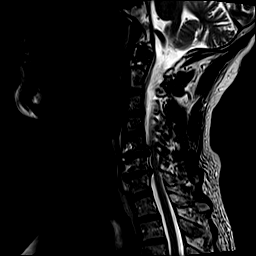
[im 10/13]
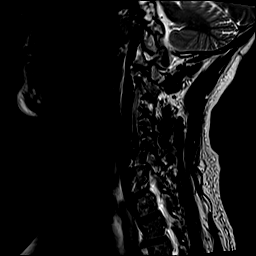
[im 13/13]
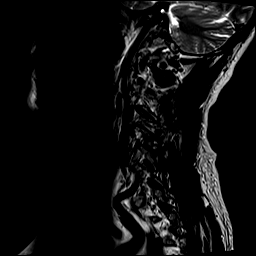

[Series 4: T1 · sagittal · 3.0mm · 0.86mm/px · 6 of 13 slices shown]
[im 1/13]
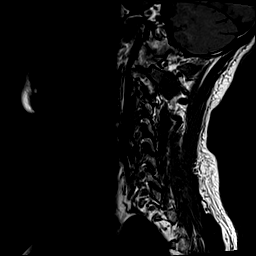
[im 3/13]
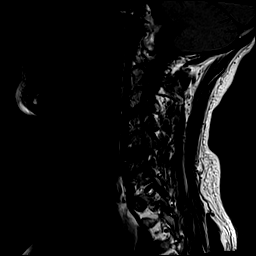
[im 5/13]
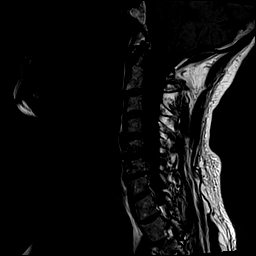
[im 8/13]
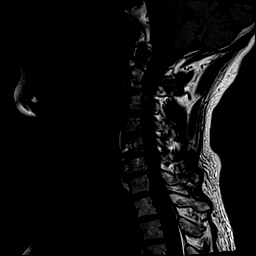
[im 10/13]
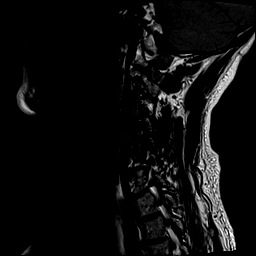
[im 13/13]
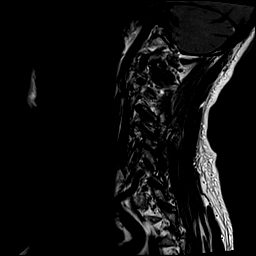

[Series 5: STIR · sagittal · 3.0mm · 0.69mm/px · 6 of 13 slices shown]
[im 1/13]
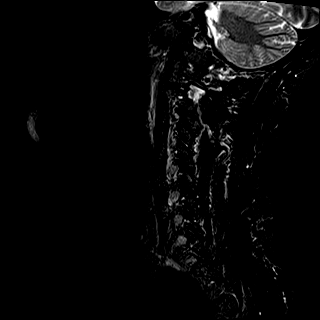
[im 3/13]
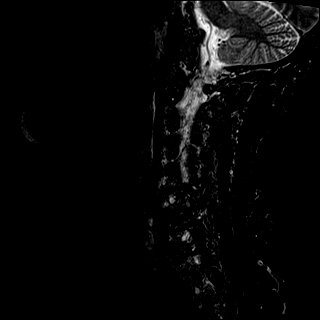
[im 5/13]
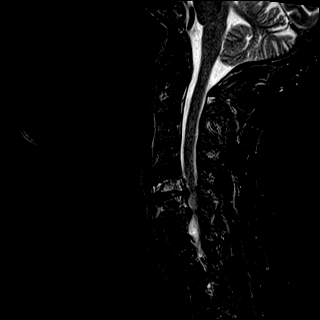
[im 8/13]
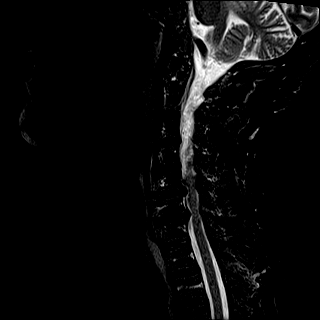
[im 10/13]
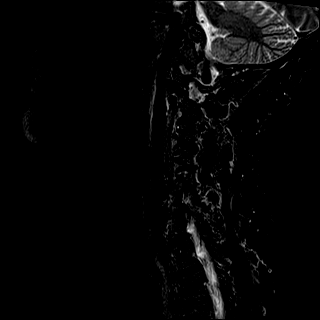
[im 13/13]
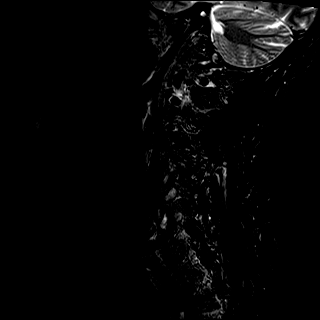

[Series 6: T2 · axial · 3.0mm · 0.70mm/px · z∈[-47,+57]mm · 15 of 34 slices shown (2 of 2)]
[im 1/34]
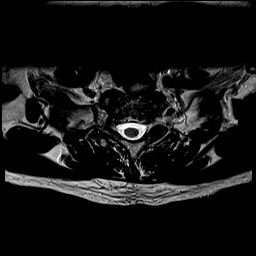
[im 3/34]
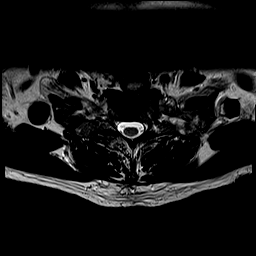
[im 5/34]
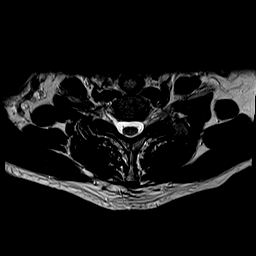
[im 8/34]
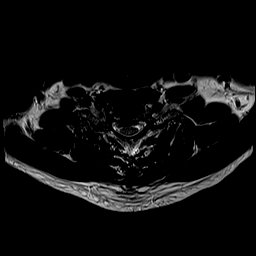
[im 10/34]
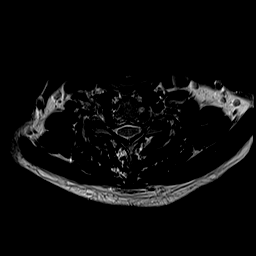
[im 12/34]
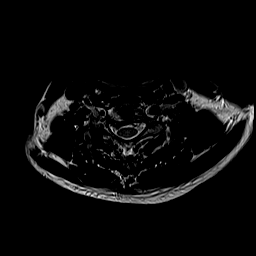
[im 15/34]
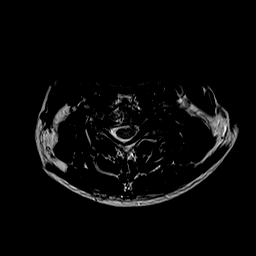
[im 17/34]
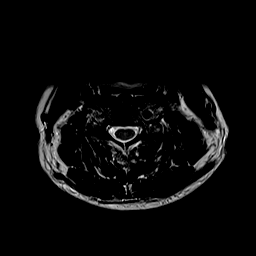
[im 19/34]
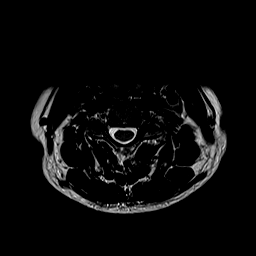
[im 22/34]
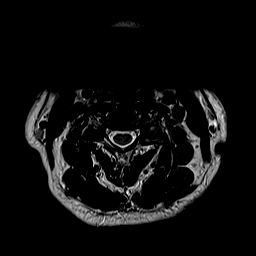
[im 24/34]
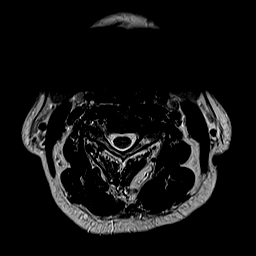
[im 26/34]
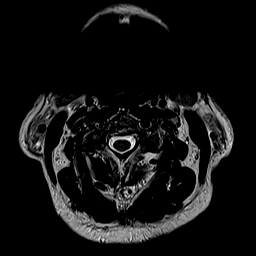
[im 29/34]
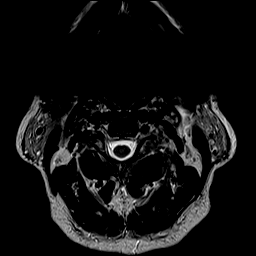
[im 31/34]
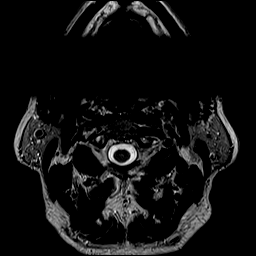
[im 34/34]
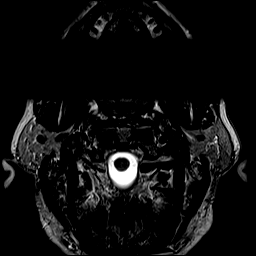

[Series 7: mpgr ax · axial · 3.0mm · 0.35mm/px · z∈[-47,+57]mm · 8 of 34 slices shown]
[im 1/34]
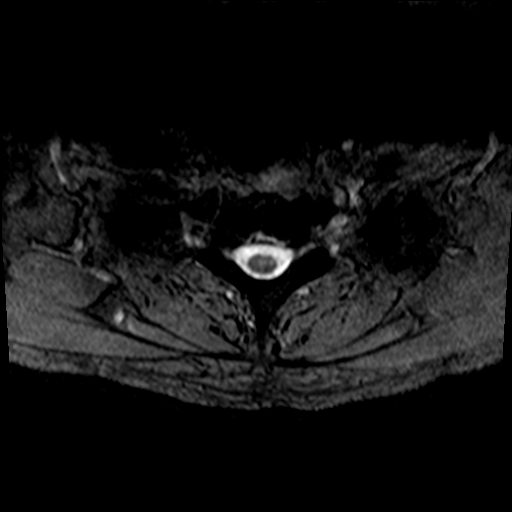
[im 5/34]
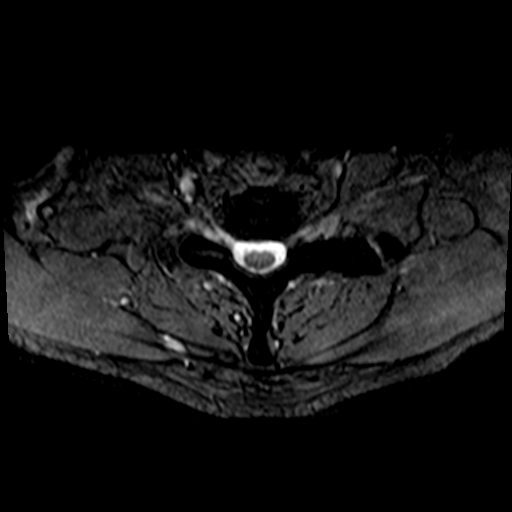
[im 10/34]
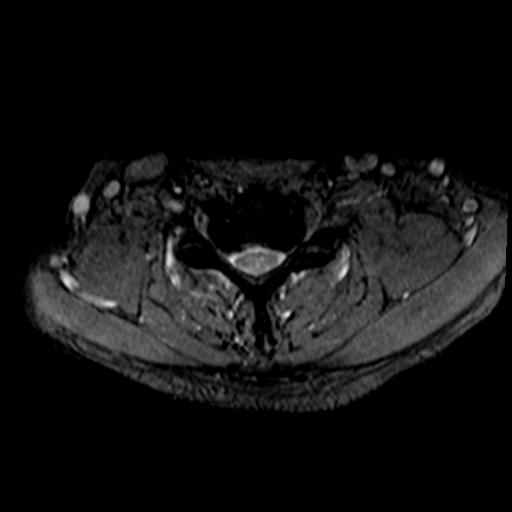
[im 15/34]
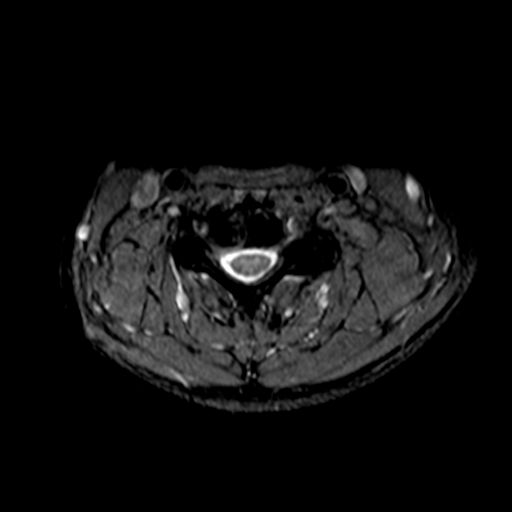
[im 19/34]
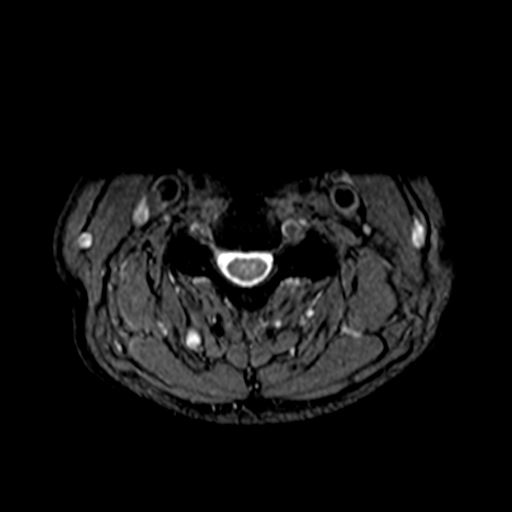
[im 24/34]
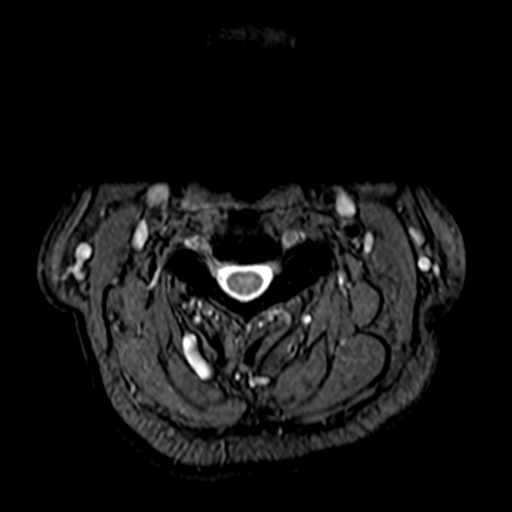
[im 29/34]
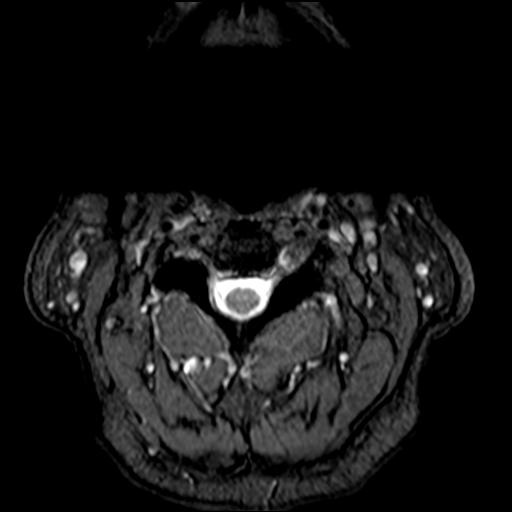
[im 34/34]
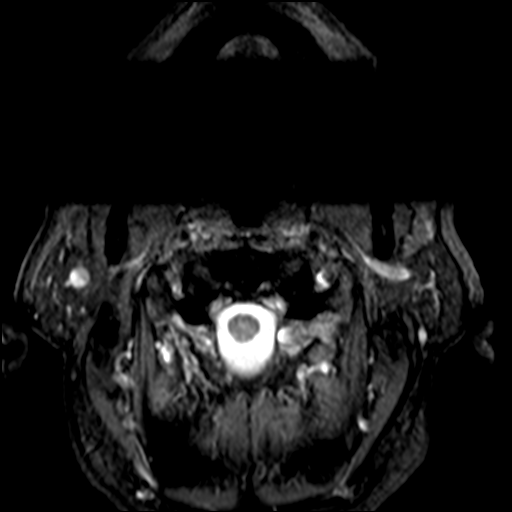

[41 of 48 positions shown; findings below may reference images not displayed]

FINDINGS: Alignment: Mild straightening of the normal cervical lordosis. No
significant listhesis.

Vertebrae: No fracture, evidence of discitis, or bone lesion.

Cord: Normal signal and morphology.

Posterior Fossa, vertebral arteries, paraspinal tissues: Negative.

Disc levels:

C2-C3: No significant disc bulge. Right-greater-than-left facet
arthropathy. Moderate right neural foraminal narrowing. No spinal
canal stenosis.

C3-C4: Minimal disc bulge. Uncovertebral and facet arthropathy. No
spinal canal stenosis. Mild left-greater-than-right neural foraminal
narrowing.

C4-C5: No significant disc bulge. Uncovertebral and facet
arthropathy. No spinal canal stenosis. Moderate left and mild right
neural foraminal narrowing.

C5-C6: Disc height loss with moderate disc bulge. Uncovertebral and
facet arthropathy. Mild-to-moderate spinal canal stenosis. Severe
right and moderate to severe left neural foraminal narrowing.

C6-C7: Disc height loss with disc osteophyte complex and moderate
disc bulge. Moderate to severe spinal canal stenosis. Uncovertebral
and facet arthropathy. Severe bilateral neural foraminal narrowing

C7-T1: Minimal disc bulge. Mild facet arthropathy. No spinal canal
stenosis or neural foraminal narrowing.
IMPRESSION: 1. C6-C7 moderate to severe spinal canal stenosis and severe
bilateral neural foraminal narrowing.
2. C5-C6 mild-to-moderate spinal canal stenosis, with severe right
and moderate to severe left neural foraminal narrowing.
3. Multilevel uncovertebral and facet arthropathy. In addition to
the aforementioned levels, there is moderate neural foraminal
narrowing on the right at C2-C3 and on the left at C4-C5.
4.

## 2021-02-22 NOTE — Progress Notes (Signed)
° ° °  Procedures performed today:    None.  Independent interpretation of notes and tests performed by another provider:   None.  Brief History, Exam, Impression, and Recommendations:    Chronic pain of right wrist Kayla returns, he is a pleasant 66 year old male, chronic pain of the right wrist on the ulnar aspect with some popping sensations, differential includes ECU subluxation as well as TFCC tear. He has had x-rays, he has had months of formal physical therapy. Medications, due to insufficient improvement we will proceed with MRI of the right wrist.  DDD (degenerative disc disease), cervical Song has also had right-sided cervical radiculitis, axial neck pain, he has tried months of formal physical therapy, x-rays do show DDD. He is not interested in medications, and he is not interested in discussing epidurals at this juncture, he does understand that this problem does not have a cure. We will proceed with MRI at this juncture. Follow-up for MRI results.  I spent 30 minutes of total time managing this patient today, this includes chart review, face to face, and non-face to face time.  Specifically we discussed anticipatory guidance with regards to his cervical DDD.   ___________________________________________ Gwen Her. Dianah Field, M.D., ABFM., CAQSM. Primary Care and [place] Instructor of [place] [employer] [facility] [employer]

## 2021-02-22 NOTE — Assessment & Plan Note (Signed)
Brett Garner returns, he is a pleasant 66 year old male, chronic pain of the right wrist on the ulnar aspect with some popping sensations, differential includes ECU subluxation as well as TFCC tear. He has had x-rays, he has had months of formal physical therapy. Medications, due to insufficient improvement we will proceed with MRI of the right wrist.

## 2021-02-22 NOTE — Assessment & Plan Note (Signed)
Brett Garner has also had right-sided cervical radiculitis, axial neck pain, he has tried months of formal physical therapy, x-rays do show DDD. He is not interested in medications, and he is not interested in discussing epidurals at this juncture, he does understand that this problem does not have a cure. We will proceed with MRI at this juncture. Follow-up for MRI results.

## 2021-02-27 ENCOUNTER — Encounter: Payer: Self-pay | Admitting: Sports Medicine

## 2021-02-28 ENCOUNTER — Other Ambulatory Visit: Payer: Self-pay

## 2021-02-28 ENCOUNTER — Ambulatory Visit (INDEPENDENT_AMBULATORY_CARE_PROVIDER_SITE_OTHER): Payer: Medicare Other

## 2021-02-28 ENCOUNTER — Ambulatory Visit (INDEPENDENT_AMBULATORY_CARE_PROVIDER_SITE_OTHER): Payer: Medicare Other | Admitting: Sports Medicine

## 2021-02-28 DIAGNOSIS — M25531 Pain in right wrist: Secondary | ICD-10-CM

## 2021-02-28 DIAGNOSIS — M5030 Other cervical disc degeneration, unspecified cervical region: Secondary | ICD-10-CM

## 2021-02-28 DIAGNOSIS — G8929 Other chronic pain: Secondary | ICD-10-CM | POA: Diagnosis not present

## 2021-02-28 NOTE — Progress Notes (Signed)
° ° °  Procedures performed today:    None.  Independent interpretation of notes and tests performed by another provider:   Procedure: Real-time Ultrasound Guided injection of the right radiocarpal joint Device: Samsung HS60  Verbal informed consent obtained.  Time-out conducted.  Noted no overlying erythema, induration, or other signs of local infection.  Skin prepped in a sterile fashion.  Local anesthesia: Topical Ethyl chloride.  With sterile technique and under real time ultrasound guidance: Noted ganglion cyst, osteoarthritis, 1 cc Kenalog 40, 1 cc lidocaine injected easily Completed without difficulty  Advised to call if fevers/chills, erythema, induration, drainage, or persistent bleeding.  Images permanently stored and available for review in PACS.  Impression: Technically successful ultrasound guided injection.  Brief History, Exam, Impression, and Recommendations:    Chronic pain of right wrist Brett Garner returns, he is a pleasant 66 year old male, chronic right-sided wrist pain, ulnar aspect as well as deep in the radiocarpal joint. We obtained an MRI that showed multiple pathologic findings including DRUJ dysfunction, TFCC fraying, osteoarthritis, ganglion cysts. There did appear to be some ECU tendinitis but I do not think this is clinically significant. Today we did a radiocarpal joint injection in the hope of easing the pain deep within his wrist, we will do a Velcro brace for the next couple of weeks. Return to see me in 4 to 6 weeks, we will consider consultation with hand surgery if not sufficiently better.  DDD (degenerative disc disease), cervical [name] also has right-sided cervical radiculitis, axial neck pain, has failed conservative treatment, we obtained an MRI that shows a dominant finding of a C6-C7 disc protrusion with mild to moderate central canal stenosis. He will continue with home conditioning, as well as some over-the-counter supplements. He does understand  that if all of this fails we will proceed with cervical epidural.    ___________________________________________ Gwen Her. Dianah Field, M.D., ABFM., CAQSM. Primary Care and [place] Instructor of [place] of [facility] of Medicine

## 2021-02-28 NOTE — Assessment & Plan Note (Signed)
Jenny Reichmann returns, he is a pleasant 66 year old male, chronic right-sided wrist pain, ulnar aspect as well as deep in the radiocarpal joint. We obtained an MRI that showed multiple pathologic findings including DRUJ dysfunction, TFCC fraying, osteoarthritis, ganglion cysts. There did appear to be some ECU tendinitis but I do not think this is clinically significant. Today we did a radiocarpal joint injection in the hope of easing the pain deep within his wrist, we will do a Velcro brace for the next couple of weeks. Return to see me in 4 to 6 weeks, we will consider consultation with hand surgery if not sufficiently better.

## 2021-02-28 NOTE — Assessment & Plan Note (Signed)
Brett Garner also has right-sided cervical radiculitis, axial neck pain, has failed conservative treatment, we obtained an MRI that shows a dominant finding of a C6-C7 disc protrusion with mild to moderate central canal stenosis. He will continue with home conditioning, as well as some over-the-counter supplements. He does understand that if all of this fails we will proceed with cervical epidural.

## 2021-03-15 ENCOUNTER — Other Ambulatory Visit: Payer: Self-pay

## 2021-03-15 ENCOUNTER — Ambulatory Visit (INDEPENDENT_AMBULATORY_CARE_PROVIDER_SITE_OTHER): Payer: Medicare Other | Admitting: Family Medicine

## 2021-03-15 ENCOUNTER — Encounter: Payer: Self-pay | Admitting: Family Medicine

## 2021-03-15 VITALS — BP 139/83 | HR 71 | Temp 98.1°F | Ht 67.82 in | Wt 170.2 lb

## 2021-03-15 DIAGNOSIS — M5030 Other cervical disc degeneration, unspecified cervical region: Secondary | ICD-10-CM

## 2021-03-15 DIAGNOSIS — Z13220 Encounter for screening for lipoid disorders: Secondary | ICD-10-CM | POA: Diagnosis not present

## 2021-03-15 DIAGNOSIS — I10 Essential (primary) hypertension: Secondary | ICD-10-CM | POA: Diagnosis not present

## 2021-03-15 DIAGNOSIS — Z0184 Encounter for antibody response examination: Secondary | ICD-10-CM

## 2021-03-15 MED ORDER — LOSARTAN POTASSIUM 25 MG PO TABS: 25.0000 mg | ORAL_TABLET | Freq: Every day | ORAL | 2 refills | Status: DC

## 2021-03-15 NOTE — Progress Notes (Signed)
Brett Garner - 66 y.o. male MRN [id number]  Date of birth: 1955-09-03  Subjective No chief complaint on file.   HPI Brett Garner is a 66 year old male here today for follow-up visit.  He has had some orthopedic issues since his last visit including issues with radicular neck pain and wrist pain.  He has seen Dr. Dianah Field for these problems.  Reports that he feels more fragile due to these problems.  Currently using wrist brace for his wrist pain.  He is seeing chiropractor for his cervical radiculopathy symptoms.  He has added turmeric and fish oil for anti-inflammatory effect.  He wants to avoid prescription medications as well as interventional procedures if possible.  Blood pressure remains well controlled with losartan.  He denies side effects related to this.  He has not had any chest pain, shortness of breath, palpitations, headaches or vision changes.  He would like to have COVID antibodies rechecked.  ROS:  A comprehensive ROS was completed and negative except as noted per HPI  Allergies  Allergen Reactions   Shellfish Allergy     History reviewed. No pertinent past medical history.  Past Surgical History:  Procedure Laterality Date   LITHOTRIPSY Left     Social History   Socioeconomic History   Marital status: Married    Spouse name: Not on file   Number of children: Not on file   Years of education: Not on file   Highest education level: Not on file  Occupational History   Not on file  Tobacco Use   Smoking status: Light Smoker    Types: Cigars   Smokeless tobacco: Never  Vaping Use   Vaping Use: Never used  Substance and Sexual Activity   Alcohol use: Yes   Drug use: Never   Sexual activity: Yes    Partners: Female    Birth control/protection: None  Other Topics Concern   Not on file  Social History Narrative   Not on file   Social Determinants of Health   Financial Resource Strain: Not on file  Food Insecurity: Not on file  Transportation Needs: Not on  file  Physical Activity: Not on file  Stress: Not on file  Social Connections: Not on file    Family History  Problem Relation Age of Onset   Hypertension Mother    Congestive Heart Failure Mother    Alcoholism Father     Health Maintenance  Topic Date Due   Hepatitis C Screening  Never done   COLONOSCOPY (Pts 45-70yrs Insurance coverage will need to be confirmed)  Never done   Zoster Vaccines- Shingrix (1 of 2) 06/13/2021 (Originally 03/08/1974)   COVID-19 Vaccine (1) 08/20/2021 (Originally 09/06/1955)   Pneumonia Vaccine 53+ Years old (1 - PCV) 03/15/2022 (Originally 03/08/1961)   TETANUS/TDAP  03/15/2022 (Originally 02/21/2015)   INFLUENZA VACCINE  Completed   HPV VACCINES  Aged Out     ----------------------------------------------------------------------------------------------------------------------------------------------------------------------------------------------------------------- Physical Exam BP 139/83 (BP Location: Left Arm, Patient Position: Sitting, Cuff Size: Normal)    Pulse 71    Temp 98.1 F (36.7 C)    Ht 5' 7.82" (1.723 m)    Wt 170 lb 3.2 oz (77.2 kg)    SpO2 98%    BMI 26.02 kg/m   Physical Exam Constitutional:      Appearance: Normal appearance.  Eyes:     General: No scleral icterus. Cardiovascular:     Rate and Rhythm: Normal rate and regular rhythm.  Pulmonary:     Effort: Pulmonary effort is  normal.     Breath sounds: Normal breath sounds.  Musculoskeletal:     Cervical back: Neck supple.  Neurological:     Mental Status: He is alert.  Psychiatric:        Mood and Affect: Mood normal.        Behavior: Behavior normal.    ------------------------------------------------------------------------------------------------------------------------------------------------------------------------------------------------------------------- Assessment and Plan  Essential hypertension Blood pressure remains well controlled with losartan.   Recommend continuation of medication at current strength.  DDD (degenerative disc disease), cervical He has seen Dr. Dianah Field for this.  Currently receiving chiropractic care but continues to have symptoms.  We discussed neck steps in management if symptoms persist including medication management or interventional procedures.   Meds ordered this encounter  Medications   losartan (COZAAR) 25 MG tablet    Sig: Take 1 tablet (25 mg total) by mouth daily.    Dispense:  90 tablet    Refill:  2    Return in about 6 months (around 09/12/2021) for HTN.    This visit occurred during the SARS-CoV-2 public health emergency.  Safety protocols were in place, including screening questions prior to the visit, additional usage of staff PPE, and extensive cleaning of exam room while observing appropriate contact time as indicated for disinfecting solutions.

## 2021-03-15 NOTE — Assessment & Plan Note (Signed)
He has seen Dr. Dianah Field for this.  Currently receiving chiropractic care but continues to have symptoms.  We discussed neck steps in management if symptoms persist including medication management or interventional procedures.

## 2021-03-15 NOTE — Assessment & Plan Note (Signed)
Blood pressure remains well controlled with losartan.  Recommend continuation of medication at current strength.

## 2021-03-16 LAB — COMPLETE METABOLIC PANEL WITH GFR
AG Ratio: 2 (calc) (ref 1.0–2.5)
ALT: 48 U/L — ABNORMAL HIGH (ref 9–46)
AST: 21 U/L (ref 10–35)
Albumin: 5.1 g/dL (ref 3.6–5.1)
Alkaline phosphatase (APISO): 53 U/L (ref 35–144)
BUN: 22 mg/dL (ref 7–25)
CO2: 34 mmol/L — ABNORMAL HIGH (ref 20–32)
Calcium: 9.8 mg/dL (ref 8.6–10.3)
Chloride: 103 mmol/L (ref 98–110)
Creat: 0.91 mg/dL (ref 0.70–1.35)
Globulin: 2.6 g/dL (calc) (ref 1.9–3.7)
Glucose, Bld: 88 mg/dL (ref 65–99)
Potassium: 4.8 mmol/L (ref 3.5–5.3)
Sodium: 141 mmol/L (ref 135–146)
Total Bilirubin: 0.7 mg/dL (ref 0.2–1.2)
Total Protein: 7.7 g/dL (ref 6.1–8.1)
eGFR: 93 mL/min/{1.73_m2} (ref >=60)

## 2021-03-16 LAB — CBC WITH DIFFERENTIAL/PLATELET
Absolute Monocytes: 336 cells/uL (ref 200–950)
Basophils Absolute: 22 cells/uL (ref 0–200)
Basophils Relative: 0.4 %
Eosinophils Absolute: 101 cells/uL (ref 15–500)
Eosinophils Relative: 1.8 %
HCT: 46.1 % (ref 38.5–50.0)
Hemoglobin: 15.6 g/dL (ref 13.2–17.1)
Lymphs Abs: 1243 cells/uL (ref 850–3900)
MCH: 30.6 pg (ref 27.0–33.0)
MCHC: 33.8 g/dL (ref 32.0–36.0)
MCV: 90.6 fL (ref 80.0–100.0)
MPV: 10 fL (ref 7.5–12.5)
Monocytes Relative: 6 %
Neutro Abs: 3898 cells/uL (ref 1500–7800)
Neutrophils Relative %: 69.6 %
Platelets: 224 10*3/uL (ref 140–400)
RBC: 5.09 10*6/uL (ref 4.20–5.80)
RDW: 12.4 % (ref 11.0–15.0)
Total Lymphocyte: 22.2 %
WBC: 5.6 10*3/uL (ref 3.8–10.8)

## 2021-03-16 LAB — LIPID PANEL W/REFLEX DIRECT LDL
Cholesterol: 179 mg/dL (ref <200)
HDL: 62 mg/dL (ref >=40)
LDL Cholesterol (Calc): 103 mg/dL (calc) — ABNORMAL HIGH
Non-HDL Cholesterol (Calc): 117 mg/dL (calc) (ref <130)
Total CHOL/HDL Ratio: 2.9 (calc) (ref <5.0)
Triglycerides: 47 mg/dL (ref <150)

## 2021-03-16 LAB — SARS-COV-2 ANTIBODY(IGG)SPIKE,SEMI-QUANTITATIVE: SARS COV1 AB(IGG)SPIKE,SEMI QN: 1.32 index — ABNORMAL HIGH (ref <1.00)

## 2021-03-28 ENCOUNTER — Other Ambulatory Visit: Payer: Self-pay

## 2021-03-28 ENCOUNTER — Ambulatory Visit (INDEPENDENT_AMBULATORY_CARE_PROVIDER_SITE_OTHER): Payer: Medicare Other | Admitting: Sports Medicine

## 2021-03-28 DIAGNOSIS — M25531 Pain in right wrist: Secondary | ICD-10-CM

## 2021-03-28 DIAGNOSIS — G8929 Other chronic pain: Secondary | ICD-10-CM

## 2021-03-28 DIAGNOSIS — M5030 Other cervical disc degeneration, unspecified cervical region: Secondary | ICD-10-CM

## 2021-03-28 NOTE — Assessment & Plan Note (Signed)
Brett Garner returns, he is a pleasant 66 year old male, he had chronic right-sided wrist pain, ulnar aspect as well as deep in the radiocarpal joint. We did obtain an MRI that showed multiple pathologic findings including DRUJ dysfunction, TFCC fraying, osteoarthritis and ganglion cysts. At the last visit his dominant pain generator seems to be within the radiocarpal joint so we injected this with ultrasound guidance. Radiocarpal joint pain is gone, he is having more discomfort at the ulnar aspect of the wrist, suspect from his TFCC injury. At this point he is going to avoid provocative motions and continue with his wrist brace. He is contemplative regarding and surgery consultation, so this is still an option for him should he desire, we would likely use Dr. Amedeo Plenty. He is also interested in returning to see me in 5 to 6 months for consideration of another injection, if his pain is predominantly ulnar aspect of the wrist we will do an injection around his TFCC.

## 2021-03-28 NOTE — Patient Instructions (Signed)
[employer] of sports medicine recommend 30 minutes of moderate intensity exercise 3-5 times per week. As for strengthening you should pick a weight that allows you to get to approximately 10 repetitions with the muscle attaining fatigue on the 10th repetition. This should be done in the series of 3 sets for each muscle group.

## 2021-03-28 NOTE — Progress Notes (Signed)
° ° °  Procedures performed today:    None.  Independent interpretation of notes and tests performed by another provider:   None.  Brief History, Exam, Impression, and Recommendations:    Chronic pain of right wrist Ponce returns, he is a pleasant 67 year old male, he had chronic right-sided wrist pain, ulnar aspect as well as deep in the radiocarpal joint. We did obtain an MRI that showed multiple pathologic findings including DRUJ dysfunction, TFCC fraying, osteoarthritis and ganglion cysts. At the last visit his dominant pain generator seems to be within the radiocarpal joint so we injected this with ultrasound guidance. Radiocarpal joint pain is gone, he is having more discomfort at the ulnar aspect of the wrist, suspect from his TFCC injury. At this point he is going to avoid provocative motions and continue with his wrist brace. He is contemplative regarding and surgery consultation, so this is still an option for him should he desire, we would likely use Dr. Amedeo Plenty. He is also interested in returning to see me in 5 to 6 months for consideration of another injection, if his pain is predominantly ulnar aspect of the wrist we will do an injection around his TFCC.  DDD (degenerative disc disease), cervical Known cervical DDD, doing much better with chiropractic manipulation. Of note we did discuss some strength training.    ___________________________________________ Gwen Her. Dianah Field, M.D., ABFM., CAQSM. Primary Care and [place] Instructor of [place] of [facility]

## 2021-03-28 NOTE — Assessment & Plan Note (Signed)
Known cervical DDD, doing much better with chiropractic manipulation. Of note we did discuss some strength training.

## 2021-09-13 ENCOUNTER — Encounter: Payer: Self-pay | Admitting: Family Medicine

## 2021-09-13 ENCOUNTER — Ambulatory Visit (INDEPENDENT_AMBULATORY_CARE_PROVIDER_SITE_OTHER): Payer: Medicare Other | Admitting: Family Medicine

## 2021-09-13 VITALS — BP 146/87 | HR 77 | Ht 67.82 in | Wt 175.0 lb

## 2021-09-13 DIAGNOSIS — M4802 Spinal stenosis, cervical region: Secondary | ICD-10-CM

## 2021-09-13 DIAGNOSIS — I10 Essential (primary) hypertension: Secondary | ICD-10-CM

## 2021-09-13 DIAGNOSIS — E559 Vitamin D deficiency, unspecified: Secondary | ICD-10-CM | POA: Diagnosis not present

## 2021-09-13 DIAGNOSIS — N529 Male erectile dysfunction, unspecified: Secondary | ICD-10-CM

## 2021-09-13 DIAGNOSIS — Z0184 Encounter for antibody response examination: Secondary | ICD-10-CM

## 2021-09-13 MED ORDER — LOSARTAN POTASSIUM 50 MG PO TABS: 50.0000 mg | ORAL_TABLET | Freq: Every day | ORAL | 3 refills | Status: DC

## 2021-09-13 MED ORDER — SILDENAFIL CITRATE 100 MG PO TABS: 50.0000 mg | ORAL_TABLET | Freq: Every day | ORAL | 11 refills | Status: DC | PRN

## 2021-09-13 NOTE — Assessment & Plan Note (Addendum)
BP is elevated today. Increasing losartan to '50mg'$  daily.  Will f/u in 6 months.  Update labs at this visit.

## 2021-09-13 NOTE — Assessment & Plan Note (Signed)
Updated vitamin d levels ordered today.

## 2021-09-13 NOTE — Progress Notes (Signed)
Brett Garner - 66 y.o. male MRN [id number]  Date of birth: 1955/11/05  Subjective Chief Complaint  Patient presents with   Hypertension    HPI Brett Garner is a 66 y.o. male here today for follow up visit.    Continues to do well with losartan at current strength.  Has been under more stress recently.  BP is elevated today.   Denies side effects at this time.  He has not had chest pain, shortness of breath , palpitations, headache or vision changes.   Would like to add sildenafil back on.  Has used this in the past and tolerated well.   Requesting referral to neurosurgery for opinion on his neck.  MRI with moderate/severe stenosis and has some pain that radiates to his hands.    Would like to have covid antibodies rechecked.     ROS:  A comprehensive ROS was completed and negative except as noted per HPI  Allergies  Allergen Reactions   Shellfish Allergy     History reviewed. No pertinent past medical history.  Past Surgical History:  Procedure Laterality Date   LITHOTRIPSY Left     Social History   Socioeconomic History   Marital status: Married    Spouse name: Not on file   Number of children: Not on file   Years of education: Not on file   Highest education level: Not on file  Occupational History   Not on file  Tobacco Use   Smoking status: Light Smoker    Types: Cigars   Smokeless tobacco: Never  Vaping Use   Vaping Use: Never used  Substance and Sexual Activity   Alcohol use: Yes   Drug use: Never   Sexual activity: Yes    Partners: Female    Birth control/protection: None  Other Topics Concern   Not on file  Social History Narrative   Not on file   Social Determinants of Health   Financial Resource Strain: Not on file  Food Insecurity: Not on file  Transportation Needs: Not on file  Physical Activity: Not on file  Stress: Not on file  Social Connections: Not on file    Family History  Problem Relation Age of Onset   Hypertension Mother     Congestive Heart Failure Mother    Alcoholism Father     Health Maintenance  Topic Date Due   Pneumonia Vaccine 52+ Years old (1 - PCV) 03/15/2022 (Originally 03/08/1961)   TETANUS/TDAP  03/15/2022 (Originally 02/21/2015)   COLONOSCOPY (Pts 45-21yr Insurance coverage will need to be confirmed)  09/14/2022 (Originally 03/08/2000)   COVID-19 Vaccine (1) 09/14/2022 (Originally 09/06/1955)   Hepatitis C Screening  09/14/2022 (Originally 03/08/1973)   Zoster Vaccines- Shingrix (1 of 2) 09/14/2022 (Originally 03/08/1974)   INFLUENZA VACCINE  09/20/2021   HPV VACCINES  Aged Out     ----------------------------------------------------------------------------------------------------------------------------------------------------------------------------------------------------------------- Physical Exam BP (!) 146/87 (BP Location: Left Arm, Patient Position: Sitting, Cuff Size: Normal)   Pulse 77   Ht 5' 7.82" (1.723 m)   Wt 175 lb (79.4 kg)   SpO2 98%   BMI 26.75 kg/m   Physical Exam Constitutional:      Appearance: Normal appearance.  Eyes:     General: No scleral icterus. Cardiovascular:     Rate and Rhythm: Normal rate and regular rhythm.  Pulmonary:     Effort: Pulmonary effort is normal.     Breath sounds: Normal breath sounds.  Musculoskeletal:     Cervical back: Neck supple.  Neurological:  Mental Status: He is alert.  Psychiatric:        Mood and Affect: Mood normal.        Behavior: Behavior normal.     ------------------------------------------------------------------------------------------------------------------------------------------------------------------------------------------------------------------- Assessment and Plan  Essential hypertension BP is elevated today. Increasing losartan to '50mg'$  daily.  Will f/u in 6 months.  Update labs at this visit.    Cervical spinal stenosis Requesting neurosurgery referral.  Orders entered.   Vitamin D  deficiency Updated vitamin d levels ordered today.   Erectile dysfunction He has done well with sildenafil in the past.  Will add this back on at 50-'100mg'$  as needed.  Red flags and avoidance of nitrates discussed.    Meds ordered this encounter  Medications   sildenafil (VIAGRA) 100 MG tablet    Sig: Take 0.5-1 tablets (50-100 mg total) by mouth daily as needed for erectile dysfunction.    Dispense:  10 tablet    Refill:  11   losartan (COZAAR) 50 MG tablet    Sig: Take 1 tablet (50 mg total) by mouth daily.    Dispense:  90 tablet    Refill:  3    Return in about 6 months (around 03/16/2022) for HTN.    This visit occurred during the SARS-CoV-2 public health emergency.  Safety protocols were in place, including screening questions prior to the visit, additional usage of staff PPE, and extensive cleaning of exam room while observing appropriate contact time as indicated for disinfecting solutions.

## 2021-09-13 NOTE — Assessment & Plan Note (Signed)
Requesting neurosurgery referral.  Orders entered.

## 2021-09-13 NOTE — Assessment & Plan Note (Signed)
He has done well with sildenafil in the past.  Will add this back on at 50-'100mg'$  as needed.  Red flags and avoidance of nitrates discussed.

## 2021-09-17 LAB — VITAMIN D 25 HYDROXY (VIT D DEFICIENCY, FRACTURES): Vit D, 25-Hydroxy: 46 ng/mL (ref 30–100)

## 2021-09-17 LAB — SARS-COV-2 SEMI-QUANTITATIVE TOTAL ANTIBODY, SPIKE: SARS COV2 AB, Total Spike Semi QN: 88.2 U/mL — ABNORMAL HIGH (ref <0.8)

## 2021-10-12 ENCOUNTER — Encounter: Payer: Self-pay | Admitting: General Practice

## 2021-10-30 ENCOUNTER — Encounter: Payer: Self-pay | Admitting: Family Medicine

## 2021-11-01 NOTE — Telephone Encounter (Signed)
Yes, that is ok 

## 2021-11-02 ENCOUNTER — Encounter: Payer: Self-pay | Admitting: Family Medicine

## 2021-12-04 ENCOUNTER — Encounter: Payer: Self-pay | Admitting: Family Medicine

## 2021-12-08 ENCOUNTER — Ambulatory Visit (INDEPENDENT_AMBULATORY_CARE_PROVIDER_SITE_OTHER): Payer: Medicare Other | Admitting: Family Medicine

## 2021-12-08 ENCOUNTER — Encounter: Payer: Self-pay | Admitting: Family Medicine

## 2021-12-08 DIAGNOSIS — L918 Other hypertrophic disorders of the skin: Secondary | ICD-10-CM | POA: Diagnosis not present

## 2021-12-08 NOTE — Patient Instructions (Signed)
Cryosurgery for Skin Conditions Cryosurgery, also called cryotherapy, is the use of extremely cold liquid (liquid nitrogen) to freeze and remove abnormal or diseased tissue. Cryosurgery may be used to remove certain growths on the skin, such as: Warts. Skin sores that could turn into cancer (precancerous skin lesions or actinic keratoses). Some skin cancers. Cryosurgery usually takes a few minutes, and it can be done in your health care provider's office. Tell a health care provider about: Any allergies you have. All medicines you are taking, including vitamins, herbs, eye drops, creams, and over-the-counter medicines. Any problems you or family members have had with anesthetic medicines. Any bleeding problems you have. Any surgeries you have had. Any medical conditions you have. Whether you are pregnant or may be pregnant. What are the risks? Your health care provider will talk with you about risks. These may include: Infection. Bleeding. Scarring. Changes in skin color (lighter or darker than normal skin tone). Swelling. Hair loss in the treated area. Damage to nearby structures or organs, such as nerve damage and loss of feeling. This is rare. What happens before the procedure? No specific preparation is needed for this procedure. Your health care provider will describe the procedure and will discuss the benefits and risks of the procedure with you. What happens during the procedure?  Your procedure will be performed using one of the following methods: Your health care provider may apply a device (probe) to the skin. The probe has liquid nitrogen flowing through it to cool it down. The probe will be applied to the skin until the skin is frozen and destroyed. Your health care provider may apply liquid nitrogen to the skin with a swab or by spraying it on the skin until the skin is frozen and destroyed. The treated area may be covered with a bandage. These procedures may vary among  health care providers and clinics. What happens after the procedure? Your blood pressure, heart rate, breathing rate, and blood oxygen level will be monitored until you leave the hospital or clinic. It is up to you to get the results of your procedure. Ask your health care provider, or the department that is doing the procedure, when your results will be ready. Summary Cryosurgery, also called cryotherapy, is the use of extremely cold liquid (liquid nitrogen) to freeze and remove abnormal or diseased tissue. Cryosurgery usually takes a few minutes, and it can be done in your health care provider's office. There are two different methods for performing cryosurgery. One method involves using a device (probe) to freeze the growth, and the other method involves applying liquid nitrogen directly to the growth. This information is not intended to replace advice given to you by your health care provider. Make sure you discuss any questions you have with your health care provider. Document Revised: 05/13/2021 Document Reviewed: 05/13/2021 Elsevier Patient Education  2023 [employer]  

## 2021-12-09 ENCOUNTER — Encounter: Payer: Self-pay | Admitting: Family Medicine

## 2021-12-09 DIAGNOSIS — L918 Other hypertrophic disorders of the skin: Secondary | ICD-10-CM | POA: Insufficient documentation

## 2021-12-09 NOTE — Progress Notes (Signed)
Brett Garner - 66 y.o. male MRN [id number]  Date of birth: Jul 02, 1955  Subjective Chief Complaint  Patient presents with   lesion check and removal    Mid back -  x 4 to 5 weeks - bothersome - patient requesting removal    Hypertension    Pt states has been having  insomnia with increased dose of cozaar- has been taking '5mg'$  melatonin and this is helping somewhat but still not getting a full night's sleep     HPI Brett Garner is a 66 year old male here today with complaint of skin tag.  Reports that he had a skin tag on his mid back noted last week.  This was irritated.  Majority of the time has seemed to have fallen off but still has area that is irritated.  Denies any bleeding over this area.  He would like to have this remnant frozen if possible.  ROS:  A comprehensive ROS was completed and negative except as noted per HPI  Allergies  Allergen Reactions   Shellfish Allergy     No past medical history on file.  Past Surgical History:  Procedure Laterality Date   LITHOTRIPSY Left     Social History   Socioeconomic History   Marital status: Married    Spouse name: Not on file   Number of children: Not on file   Years of education: Not on file   Highest education level: Not on file  Occupational History   Not on file  Tobacco Use   Smoking status: Light Smoker    Types: Cigars   Smokeless tobacco: Never  Vaping Use   Vaping Use: Never used  Substance and Sexual Activity   Alcohol use: Yes   Drug use: Never   Sexual activity: Yes    Partners: Female    Birth control/protection: None  Other Topics Concern   Not on file  Social History Narrative   Not on file   Social Determinants of Health   Financial Resource Strain: Not on file  Food Insecurity: Not on file  Transportation Needs: Not on file  Physical Activity: Not on file  Stress: Not on file  Social Connections: Not on file    Family History  Problem Relation Age of Onset   Hypertension Mother     Congestive Heart Failure Mother    Alcoholism Father     Health Maintenance  Topic Date Due   Pneumonia Vaccine 70+ Years old (1 - PCV) 03/15/2022 (Originally 03/08/1961)   TETANUS/TDAP  03/15/2022 (Originally 02/21/2015)   COLONOSCOPY (Pts 45-43yr Insurance coverage will need to be confirmed)  09/14/2022 (Originally 03/08/2000)   COVID-19 Vaccine (1) 09/14/2022 (Originally 03/08/1960)   Hepatitis C Screening  09/14/2022 (Originally 03/08/1973)   Zoster Vaccines- Shingrix (1 of 2) 09/14/2022 (Originally 03/08/1974)   INFLUENZA VACCINE  Completed   HPV VACCINES  Aged Out     ----------------------------------------------------------------------------------------------------------------------------------------------------------------------------------------------------------------- Physical Exam BP 132/80   Pulse 69   Ht 5' 7.82" (1.723 m)   Wt 176 lb 12 oz (80.2 kg)   SpO2 99%   BMI 27.02 kg/m   Physical Exam Skin:    Comments: Remnant of skin tag in the mid back area.  Neurological:     Mental Status: He is alert.   Procedure note: Procedure reviewed with patient.  Area of skin tag treated with liquid nitrogen achieving 2 mm Frost ring around lesion.  He tolerated this well.  Postprocedure instructions given.  ------------------------------------------------------------------------------------------------------------------------------------------------------------------------------------------------------------------- Assessment and Plan  Skin tag Treated with  liquid nitrogen today.  He will let me know if any continued problems or reoccurrence.   No orders of the defined types were placed in this encounter.   No follow-ups on file.    This visit occurred during the SARS-CoV-2 public health emergency.  Safety protocols were in place, including screening questions prior to the visit, additional usage of staff PPE, and extensive cleaning of exam room while observing  appropriate contact time as indicated for disinfecting solutions.

## 2021-12-09 NOTE — Assessment & Plan Note (Signed)
Treated with liquid nitrogen today.  He will let me know if any continued problems or reoccurrence.

## 2022-03-16 ENCOUNTER — Ambulatory Visit: Payer: Medicare Other | Admitting: Family Medicine

## 2022-03-22 ENCOUNTER — Ambulatory Visit (INDEPENDENT_AMBULATORY_CARE_PROVIDER_SITE_OTHER): Payer: Medicare Other | Admitting: Family Medicine

## 2022-03-22 ENCOUNTER — Encounter: Payer: Self-pay | Admitting: Family Medicine

## 2022-03-22 VITALS — BP 130/78 | HR 67 | Ht 67.82 in | Wt 177.0 lb

## 2022-03-22 DIAGNOSIS — I10 Essential (primary) hypertension: Secondary | ICD-10-CM | POA: Diagnosis not present

## 2022-03-22 DIAGNOSIS — Z23 Encounter for immunization: Secondary | ICD-10-CM | POA: Diagnosis not present

## 2022-03-22 DIAGNOSIS — Z13220 Encounter for screening for lipoid disorders: Secondary | ICD-10-CM | POA: Diagnosis not present

## 2022-03-22 DIAGNOSIS — E559 Vitamin D deficiency, unspecified: Secondary | ICD-10-CM | POA: Diagnosis not present

## 2022-03-22 DIAGNOSIS — Z0184 Encounter for antibody response examination: Secondary | ICD-10-CM

## 2022-03-22 NOTE — Assessment & Plan Note (Signed)
Update vitamin d levels.

## 2022-03-22 NOTE — Progress Notes (Signed)
Brett Garner - 67 y.o. male MRN [id number]  Date of birth: 06-24-1955  Subjective Chief Complaint  Patient presents with   Insomnia    HPI Brett Garner is a 67 y.o. male here today for follow up visit.   Reports that he is feeling pretty well.  He continues on losartan '50mg'$  daily for management of HTN.  He thinks that this is causing insomnia.  Denies any other side effects.    Has not had chest pain, shortness of breath, palpitations, headache or vision changes.   Seen by neurosurgery previously for cervical radiculopathy.  Stable at this time.  Seeing chiropractor for maintenance.  Taking turmeric and fish oil for anti-inflammatory effect.   ROS:  A comprehensive ROS was completed and negative except as noted per HPI    Allergies  Allergen Reactions   Shellfish Allergy     History reviewed. No pertinent past medical history.  Past Surgical History:  Procedure Laterality Date   LITHOTRIPSY Left     Social History   Socioeconomic History   Marital status: Married    Spouse name: Not on file   Number of children: Not on file   Years of education: Not on file   Highest education level: Not on file  Occupational History   Not on file  Tobacco Use   Smoking status: Light Smoker    Types: Cigars   Smokeless tobacco: Never  Vaping Use   Vaping Use: Never used  Substance and Sexual Activity   Alcohol use: Yes   Drug use: Never   Sexual activity: Yes    Partners: Female    Birth control/protection: None  Other Topics Concern   Not on file  Social History Narrative   Not on file   Social Determinants of Health   Financial Resource Strain: Not on file  Food Insecurity: Not on file  Transportation Needs: Not on file  Physical Activity: Not on file  Stress: Not on file  Social Connections: Not on file    Family History  Problem Relation Age of Onset   Hypertension Mother    Congestive Heart Failure Mother    Alcoholism Father     Health Maintenance   Topic Date Due   Medicare Annual Wellness (AWV)  04/20/2022 (Originally July 26, 1955)   COLONOSCOPY (Pts 45-41yr Insurance coverage will need to be confirmed)  09/14/2022 (Originally 03/08/2000)   COVID-19 Vaccine (1) 09/14/2022 (Originally 03/08/1960)   Hepatitis C Screening  09/14/2022 (Originally 03/08/1973)   Zoster Vaccines- Shingrix (1 of 2) 09/14/2022 (Originally 03/08/1974)   Pneumonia Vaccine 67 Years old (1 - PCV) 03/23/2023 (Originally 03/08/1961)   DTaP/Tdap/Td (3 - Td or Tdap) 03/22/2032   INFLUENZA VACCINE  Completed   HPV VACCINES  Aged Out     ----------------------------------------------------------------------------------------------------------------------------------------------------------------------------------------------------------------- Physical Exam BP 130/78 (BP Location: Left Arm, Patient Position: Sitting, Cuff Size: Normal)   Pulse 67   Ht 5' 7.82" (1.723 m)   Wt 177 lb (80.3 kg)   SpO2 99%   BMI 27.06 kg/m   Physical Exam Constitutional:      Appearance: Normal appearance.  Eyes:     General: No scleral icterus. Cardiovascular:     Rate and Rhythm: Normal rate and regular rhythm.  Pulmonary:     Effort: Pulmonary effort is normal.     Breath sounds: Normal breath sounds.  Musculoskeletal:     Cervical back: Neck supple.  Neurological:     Mental Status: He is alert.  Psychiatric:  Mood and Affect: Mood normal.        Behavior: Behavior normal.     ------------------------------------------------------------------------------------------------------------------------------------------------------------------------------------------------------------------- Assessment and Plan  Essential hypertension BP is well controlled with losartan at current strength.  Encouraged continued healthy lifestyle and will continue losartan at current strength.    Vitamin D deficiency Update vitamin d levels.    No orders of the defined types were  placed in this encounter.   Return in about 6 months (around 09/20/2022) for HTN.    This visit occurred during the SARS-CoV-2 public health emergency.  Safety protocols were in place, including screening questions prior to the visit, additional usage of staff PPE, and extensive cleaning of exam room while observing appropriate contact time as indicated for disinfecting solutions.

## 2022-03-22 NOTE — Assessment & Plan Note (Signed)
BP is well controlled with losartan at current strength.  Encouraged continued healthy lifestyle and will continue losartan at current strength.

## 2022-03-24 ENCOUNTER — Other Ambulatory Visit: Payer: Self-pay | Admitting: Family Medicine

## 2022-03-26 LAB — LIPID PANEL W/REFLEX DIRECT LDL
Cholesterol: 181 mg/dL (ref <200)
HDL: 57 mg/dL (ref >=40)
LDL Cholesterol (Calc): 108 mg/dL (calc) — ABNORMAL HIGH
Non-HDL Cholesterol (Calc): 124 mg/dL (calc) (ref <130)
Total CHOL/HDL Ratio: 3.2 (calc) (ref <5.0)
Triglycerides: 70 mg/dL (ref <150)

## 2022-03-26 LAB — COMPLETE METABOLIC PANEL WITH GFR
AG Ratio: 2 (calc) (ref 1.0–2.5)
ALT: 49 U/L — ABNORMAL HIGH (ref 9–46)
AST: 26 U/L (ref 10–35)
Albumin: 4.9 g/dL (ref 3.6–5.1)
Alkaline phosphatase (APISO): 43 U/L (ref 35–144)
BUN: 18 mg/dL (ref 7–25)
CO2: 30 mmol/L (ref 20–32)
Calcium: 10 mg/dL (ref 8.6–10.3)
Chloride: 104 mmol/L (ref 98–110)
Creat: 0.89 mg/dL (ref 0.70–1.35)
Globulin: 2.5 g/dL (calc) (ref 1.9–3.7)
Glucose, Bld: 94 mg/dL (ref 65–99)
Potassium: 4.7 mmol/L (ref 3.5–5.3)
Sodium: 141 mmol/L (ref 135–146)
Total Bilirubin: 0.7 mg/dL (ref 0.2–1.2)
Total Protein: 7.4 g/dL (ref 6.1–8.1)
eGFR: 94 mL/min/{1.73_m2} (ref >=60)

## 2022-03-26 LAB — CBC WITH DIFFERENTIAL/PLATELET
Absolute Monocytes: 319 cells/uL (ref 200–950)
Basophils Absolute: 42 cells/uL (ref 0–200)
Basophils Relative: 1 %
Eosinophils Absolute: 189 cells/uL (ref 15–500)
Eosinophils Relative: 4.5 %
HCT: 43.6 % (ref 38.5–50.0)
Hemoglobin: 15.1 g/dL (ref 13.2–17.1)
Lymphs Abs: 1340 cells/uL (ref 850–3900)
MCH: 31.3 pg (ref 27.0–33.0)
MCHC: 34.6 g/dL (ref 32.0–36.0)
MCV: 90.5 fL (ref 80.0–100.0)
MPV: 10.7 fL (ref 7.5–12.5)
Monocytes Relative: 7.6 %
Neutro Abs: 2310 cells/uL (ref 1500–7800)
Neutrophils Relative %: 55 %
Platelets: 199 10*3/uL (ref 140–400)
RBC: 4.82 10*6/uL (ref 4.20–5.80)
RDW: 13 % (ref 11.0–15.0)
Total Lymphocyte: 31.9 %
WBC: 4.2 10*3/uL (ref 3.8–10.8)

## 2022-03-26 LAB — VITAMIN D 25 HYDROXY (VIT D DEFICIENCY, FRACTURES): Vit D, 25-Hydroxy: 47 ng/mL (ref 30–100)

## 2022-03-26 LAB — SARS-COV-2 SEMI-QUANTITATIVE TOTAL ANTIBODY, SPIKE: SARS COV2 AB, Total Spike Semi QN: 74.1 U/mL — ABNORMAL HIGH (ref <0.8)

## 2022-04-02 ENCOUNTER — Encounter: Payer: Self-pay | Admitting: Family Medicine

## 2022-05-25 ENCOUNTER — Telehealth: Payer: Self-pay | Admitting: Family Medicine

## 2022-05-25 NOTE — Telephone Encounter (Signed)
Contacted Brett Garner to schedule their annual wellness visit. Appointment made for 08/09/2022 at [facility] Direct Dial: [phone]

## 2022-08-09 ENCOUNTER — Ambulatory Visit (INDEPENDENT_AMBULATORY_CARE_PROVIDER_SITE_OTHER): Payer: Medicare Other | Admitting: Family Medicine

## 2022-08-09 DIAGNOSIS — Z0000 Encounter for general adult medical examination without abnormal findings: Secondary | ICD-10-CM

## 2022-08-09 NOTE — Progress Notes (Signed)
MEDICARE ANNUAL WELLNESS VISIT  08/09/2022  Telephone Visit Disclaimer This Medicare AWV was conducted by telephone due to national recommendations for restrictions regarding the COVID-19 Pandemic (e.g. social distancing).  I verified, using two identifiers, that I am speaking with Brett Garner or their authorized healthcare agent. I discussed the limitations, risks, security, and privacy concerns of performing an evaluation and management service by telephone and the potential availability of an in-person appointment in the future. The patient expressed understanding and agreed to proceed.  Location of Patient: Home Location of Provider (nurse):  In the office.  Subjective:    Brett Garner is a 67 y.o. male patient of Brett Coombe, DO who had a Medicare Annual Wellness Visit today via telephone. [place] is Retired and lives with their spouse. he does not have any children. he reports that he is socially active and does interact with friends/family regularly. he is moderately physically active and enjoys investing and mentally active.  Patient Care Team: Brett Coombe, DO as PCP - General (Family Medicine)     08/09/2022   10:04 AM 10/20/2020    2:52 PM 04/01/2019    9:49 AM  Advanced Directives  Does Patient Have a Medical Advance Directive? No No No  Would patient like information on creating a medical advance directive? No - Patient declined No - Patient declined No - Patient declined    Hospital Utilization Over the Past 12 Months: # of hospitalizations or ER visits: 0 # of surgeries: 0  Review of Systems    Patient reports that his overall health is better compared to last year.  History obtained from chart review and the patient  Patient Reported Readings (BP, Pulse, CBG, Weight, etc) none  Pain Assessment Pain : No/denies pain     Current Medications & Allergies (verified) Allergies as of 08/09/2022       Reactions   Shellfish Allergy         Medication List         Accurate as of August 09, 2022 10:17 AM. If you have any questions, ask your nurse or doctor.          losartan 50 MG tablet Commonly known as: Cozaar Take 1 tablet (50 mg total) by mouth daily. What changed: Another medication with the same name was removed. Continue taking this medication, and follow the directions you see here.   sildenafil 100 MG tablet Commonly known as: Viagra Take 0.5-1 tablets (50-100 mg total) by mouth daily as needed for erectile dysfunction.   UNABLE TO FIND Med Name: Sleep 3        History (reviewed): Past Medical History:  Diagnosis Date   Spinal stenosis    Past Surgical History:  Procedure Laterality Date   LITHOTRIPSY Left    Family History  Problem Relation Age of Onset   Hypertension Mother    Congestive Heart Failure Mother    Alcoholism Father    Social History   Socioeconomic History   Marital status: Married    Spouse name: Alan Ripper   Number of children: 0   Years of education: 18   Highest education level: Master's degree (e.g., MA, MS, MEng, MEd, MSW, MBA)  Occupational History   Occupation: Retired  Tobacco Use   Smoking status: Light Smoker    Types: Cigars   Smokeless tobacco: Never  Vaping Use   Vaping Use: Never used  Substance and Sexual Activity   Alcohol use: Yes   Drug use: Never  Sexual activity: Yes    Partners: Female    Birth control/protection: None  Other Topics Concern   Not on file  Social History Narrative   Lives with his spouse. Enjoys investing and staying mentally active.   Social Determinants of Health   Financial Resource Strain: Low Risk  (08/09/2022)   Overall Financial Resource Strain (CARDIA)    Difficulty of Paying Living Expenses: Not hard at all  Food Insecurity: No Food Insecurity (08/09/2022)   Hunger Vital Sign    Worried About Running Out of Food in the Last Year: Never true    Ran Out of Food in the Last Year: Never true  Transportation Needs: No Transportation  Needs (08/09/2022)   PRAPARE - Administrator, Civil Service (Medical): No    Lack of Transportation (Non-Medical): No  Physical Activity: Sufficiently Active (08/09/2022)   Exercise Vital Sign    Days of Exercise per Week: 7 days    Minutes of Exercise per Session: 60 min  Stress: No Stress Concern Present (08/09/2022)   [employer] of Occupational Health - Occupational Stress Questionnaire    Feeling of Stress : Not at all  Social Connections: Moderately Integrated (08/09/2022)   Social Connection and Isolation Panel [NHANES]    Frequency of Communication with Friends and Family: More than three times a week    Frequency of Social Gatherings with Friends and Family: Three times a week    Attends Religious Services: More than 4 times per year    Active Member of Clubs or Organizations: No    Attends Banker Meetings: Never    Marital Status: Married    Activities of Daily Living    08/09/2022   10:04 AM  In your present state of health, do you have any difficulty performing the following activities:  Hearing? 0  Vision? 0  Difficulty concentrating or making decisions? 0  Walking or climbing stairs? 0  Dressing or bathing? 0  Doing errands, shopping? 0  Preparing Food and eating ? N  Using the Toilet? N  In the past six months, have you accidently leaked urine? N  Do you have problems with loss of bowel control? N  Managing your Medications? N  Managing your Finances? N  Housekeeping or managing your Housekeeping? N    Patient Education/ Literacy How often do you need to have someone help you when you read instructions, pamphlets, or other written materials from your doctor or pharmacy?: 1 - Never What is the last grade level you completed in school?: Masters degree  Exercise    Diet Patient reports consuming 3 meals a day and 0 snack(s) a day Patient reports that his primary diet is: Regular Patient reports that she does have regular access  to food.   Depression Screen    08/09/2022   10:04 AM 03/22/2022    8:13 AM 12/08/2021   11:52 AM 09/13/2021    8:17 AM 09/13/2020    8:27 AM 04/01/2019    9:53 AM  PHQ 2/9 Scores  PHQ - 2 Score 0 0 0 0 0 0     Fall Risk    08/09/2022   10:04 AM 03/22/2022    8:13 AM 12/08/2021   11:52 AM 09/13/2021    8:17 AM 09/13/2020    8:27 AM  Fall Risk   Falls in the past year? 0 0 1 0 0  Number falls in past yr: 0 0 1 0 0  Injury with Fall?  0 0 0 0 0  Risk for fall due to : No Fall Risks No Fall Risks History of fall(s) No Fall Risks No Fall Risks  Follow up Falls evaluation completed Falls evaluation completed Falls evaluation completed Falls evaluation completed Falls evaluation completed     Objective:  Avir Perko seemed alert and oriented and he participated appropriately during our telephone visit.  Blood Pressure Weight BMI  BP Readings from Last 3 Encounters:  03/22/22 130/78  12/08/21 132/80  09/13/21 (!) 146/87   Wt Readings from Last 3 Encounters:  03/22/22 177 lb (80.3 kg)  12/08/21 176 lb 12 oz (80.2 kg)  09/13/21 175 lb (79.4 kg)   BMI Readings from Last 1 Encounters:  03/22/22 27.06 kg/m    *Unable to obtain current vital signs, weight, and BMI due to telephone visit type  Hearing/Vision  Satchel did not seem to have difficulty with hearing/understanding during the telephone conversation Reports that he has had a formal eye exam by an eye care professional within the past year Reports that he has not had a formal hearing evaluation within the past year *Unable to fully assess hearing and vision during telephone visit type  Cognitive Function:    08/09/2022   10:09 AM  6CIT Screen  What Year? 0 points  What month? 0 points  What time? 0 points  Count back from 20 0 points  Months in reverse 0 points  Repeat phrase 0 points  Total Score 0 points   (Normal:0-7, Significant for Dysfunction: >8)  Normal Cognitive Function Screening: Yes   Immunization &  Health Maintenance Record Immunization History  Administered Date(s) Administered   Fluad Quad(high Dose 65+) 10/21/2020, 11/08/2021   Influenza-Unspecified 12/22/2018   Tdap 02/20/2005, 03/22/2022    Health Maintenance  Topic Date Due   Colonoscopy  09/14/2022 (Originally 03/08/2000)   COVID-19 Vaccine (1) 09/14/2022 (Originally 03/08/1960)   Hepatitis C Screening  09/14/2022 (Originally 03/08/1973)   Zoster Vaccines- Shingrix (1 of 2) 09/14/2022 (Originally 03/08/1974)   Pneumonia Vaccine 82+ Years old (1 of 2 - PCV) 03/23/2023 (Originally 03/08/1961)   INFLUENZA VACCINE  09/21/2022   Medicare Annual Wellness (AWV)  08/09/2023   DTaP/Tdap/Td (3 - Td or Tdap) 03/22/2032   HPV VACCINES  Aged Out       Assessment  This is a routine wellness examination for [place].  Health Maintenance: Due or Overdue There are no preventive care reminders to display for this patient.   Brett Garner does not need a referral for Community Assistance: Care Management:   no Social Work:    no Prescription Assistance:  no Nutrition/Diabetes Education:  no   Plan:  Personalized Goals  Goals Addressed               This Visit's Progress     Patient Stated (pt-stated)        Patient stated that he would like to continue to maintain his current lifestyle.       Personalized Health Maintenance & Screening Recommendations  Pneumococcal vaccine  Colorectal cancer screening Shingles vaccine  Patient declined Pneumococcal and shingles vaccine at this time. He also declined the colorectal cancer screening referral at this time.   Lung Cancer Screening Recommended: no (Low Dose CT Chest recommended if Age 59-80 years, 20 pack-year currently smoking OR have quit w/in past 15 years) Hepatitis C Screening recommended: yes HIV Screening recommended: no  Advanced Directives: Written information was not prepared per patient's request.  Referrals & Orders No  orders of the defined types were  placed in this encounter.   Follow-up Plan Follow-up with Brett Coombe, DO as planned Please let us know when you are ready for the colonoscopy referral. Medicare wellness visit in one year.  Patient will access AVS on my chart.   I have personally reviewed and noted the following in the patient's chart:   Medical and social history Use of alcohol, tobacco or illicit drugs  Current medications and supplements Functional ability and status Nutritional status Physical activity Advanced directives List of other physicians Hospitalizations, surgeries, and ER visits in previous 12 months Vitals Screenings to include cognitive, depression, and falls Referrals and appointments  In addition, I have reviewed and discussed with Brett Garner certain preventive protocols, quality metrics, and best practice recommendations. A written personalized care plan for preventive services as well as general preventive health recommendations is available and can be mailed to the patient at his request.      Modesto Charon, RN BSN  08/09/2022

## 2022-08-09 NOTE — Patient Instructions (Addendum)
MEDICARE ANNUAL WELLNESS VISIT Health Maintenance Summary and Written Plan of Care  [name] ,  Thank you for allowing me to perform your Medicare Annual Wellness Visit and for your ongoing commitment to your health.   Health Maintenance & Immunization History Health Maintenance  Topic Date Due   Colonoscopy  09/14/2022 (Originally 03/08/2000)   COVID-19 Vaccine (1) 09/14/2022 (Originally 03/08/1960)   Hepatitis C Screening  09/14/2022 (Originally 03/08/1973)   Zoster Vaccines- Shingrix (1 of 2) 09/14/2022 (Originally 03/08/1974)   Pneumonia Vaccine 82+ Years old (1 of 2 - PCV) 03/23/2023 (Originally 03/08/1961)   INFLUENZA VACCINE  09/21/2022   Medicare Annual Wellness (AWV)  08/09/2023   DTaP/Tdap/Td (3 - Td or Tdap) 03/22/2032   HPV VACCINES  Aged Out   Immunization History  Administered Date(s) Administered   Fluad Quad(high Dose 65+) 10/21/2020, 11/08/2021   Influenza-Unspecified 12/22/2018   Tdap 02/20/2005, 03/22/2022    These are the patient goals that we discussed:  Goals Addressed               This Visit's Progress     Patient Stated (pt-stated)        Patient stated that he would like to continue to maintain his current lifestyle.         This is a list of Health Maintenance Items that are overdue or due now: Pneumococcal vaccine  Colorectal cancer screening Shingles vaccine  Patient declined Pneumococcal and shingles vaccine at this time. He also declined the colorectal cancer screening referral at this time.   Orders/Referrals Placed Today: No orders of the defined types were placed in this encounter.  (Contact our referral department at [phone] if you have not spoken with someone about your referral appointment within the next 5 days)    Follow-up Plan Follow-up with Brett Coombe, DO as planned Please let us know when you are ready for the colonoscopy referral. Medicare wellness visit in one year.  Patient will access AVS on my  chart.      Health Maintenance, Male Adopting a healthy lifestyle and getting preventive care are important in promoting health and wellness. Ask your health care provider about: The right schedule for you to have regular tests and exams. Things you can do on your own to prevent diseases and keep yourself healthy. What should I know about diet, weight, and exercise? Eat a healthy diet  Eat a diet that includes plenty of vegetables, fruits, low-fat dairy products, and lean protein. Do not eat a lot of foods that are high in solid fats, added sugars, or sodium. Maintain a healthy weight Body mass index (BMI) is a measurement that can be used to identify possible weight problems. It estimates body fat based on height and weight. Your health care provider can help determine your BMI and help you achieve or maintain a healthy weight. Get regular exercise Get regular exercise. This is one of the most important things you can do for your health. Most adults should: Exercise for at least 150 minutes each week. The exercise should increase your heart rate and make you sweat (moderate-intensity exercise). Do strengthening exercises at least twice a week. This is in addition to the moderate-intensity exercise. Spend less time sitting. Even light physical activity can be beneficial. Watch cholesterol and blood lipids Have your blood tested for lipids and cholesterol at 67 years of age, then have this test every 5 years. You may need to have your cholesterol levels checked more often if: Your lipid or  cholesterol levels are high. You are older than 67 years of age. You are at high risk for heart disease. What should I know about cancer screening? Many types of cancers can be detected early and may often be prevented. Depending on your health history and family history, you may need to have cancer screening at various ages. This may include screening for: Colorectal cancer. Prostate cancer. Skin  cancer. Lung cancer. What should I know about heart disease, diabetes, and high blood pressure? Blood pressure and heart disease High blood pressure causes heart disease and increases the risk of stroke. This is more likely to develop in people who have high blood pressure readings or are overweight. Talk with your health care provider about your target blood pressure readings. Have your blood pressure checked: Every 3-5 years if you are 76-38 years of age. Every year if you are 17 years old or older. If you are between the ages of 12 and 82 and are a current or former smoker, ask your health care provider if you should have a one-time screening for abdominal aortic aneurysm (AAA). Diabetes Have regular diabetes screenings. This checks your fasting blood sugar level. Have the screening done: Once every three years after age 52 if you are at a normal weight and have a low risk for diabetes. More often and at a younger age if you are overweight or have a high risk for diabetes. What should I know about preventing infection? Hepatitis B If you have a higher risk for hepatitis B, you should be screened for this virus. Talk with your health care provider to find out if you are at risk for hepatitis B infection. Hepatitis C Blood testing is recommended for: Everyone born from [birth year] through 1965. Anyone with known risk factors for hepatitis C. Sexually transmitted infections (STIs) You should be screened each year for STIs, including gonorrhea and chlamydia, if: You are sexually active and are younger than 67 years of age. You are older than 67 years of age and your health care provider tells you that you are at risk for this type of infection. Your sexual activity has changed since you were last screened, and you are at increased risk for chlamydia or gonorrhea. Ask your health care provider if you are at risk. Ask your health care provider about whether you are at high risk for HIV. Your health  care provider may recommend a prescription medicine to help prevent HIV infection. If you choose to take medicine to prevent HIV, you should first get tested for HIV. You should then be tested every 3 months for as long as you are taking the medicine. Follow these instructions at home: Alcohol use Do not drink alcohol if your health care provider tells you not to drink. If you drink alcohol: Limit how much you have to 0-2 drinks a day. Know how much alcohol is in your drink. In the [place] one drink equals one 12 oz bottle of beer (355 mL), one 5 oz glass of wine (148 mL), or one 1 oz glass of hard liquor (44 mL). Lifestyle Do not use any products that contain nicotine or tobacco. These products include cigarettes, chewing tobacco, and vaping devices, such as e-cigarettes. If you need help quitting, ask your health care provider. Do not use street drugs. Do not share needles. Ask your health care provider for help if you need support or information about quitting drugs. General instructions Schedule regular health, dental, and eye exams. Stay current with your  vaccines. Tell your health care provider if: You often feel depressed. You have ever been abused or do not feel safe at home. Summary Adopting a healthy lifestyle and getting preventive care are important in promoting health and wellness. Follow your health care provider's instructions about healthy diet, exercising, and getting tested or screened for diseases. Follow your health care provider's instructions on monitoring your cholesterol and blood pressure. This information is not intended to replace advice given to you by your health care provider. Make sure you discuss any questions you have with your health care provider. Document Revised: 06/28/2020 Document Reviewed: 06/28/2020 Elsevier Patient Education  2024 [employer].

## 2022-09-20 ENCOUNTER — Other Ambulatory Visit: Payer: Self-pay | Admitting: Family Medicine

## 2022-09-20 ENCOUNTER — Encounter: Payer: Self-pay | Admitting: Family Medicine

## 2022-09-20 ENCOUNTER — Ambulatory Visit (INDEPENDENT_AMBULATORY_CARE_PROVIDER_SITE_OTHER): Payer: Medicare Other | Admitting: Family Medicine

## 2022-09-20 VITALS — BP 147/87 | HR 71 | Ht 67.82 in | Wt 181.0 lb

## 2022-09-20 DIAGNOSIS — N529 Male erectile dysfunction, unspecified: Secondary | ICD-10-CM

## 2022-09-20 DIAGNOSIS — I10 Essential (primary) hypertension: Secondary | ICD-10-CM

## 2022-09-20 DIAGNOSIS — M4802 Spinal stenosis, cervical region: Secondary | ICD-10-CM

## 2022-09-20 MED ORDER — LOSARTAN POTASSIUM 25 MG PO TABS: 37.5000 mg | ORAL_TABLET | Freq: Every day | ORAL | 1 refills | Status: DC

## 2022-09-20 MED ORDER — SILDENAFIL CITRATE 100 MG PO TABS: 50.0000 mg | ORAL_TABLET | Freq: Every day | ORAL | 11 refills | Status: DC | PRN

## 2022-09-20 NOTE — Assessment & Plan Note (Signed)
BP remains elevated at losartan 25mg , but feels that he gets too low at 50mg .  Will change to 1.5 tablets of losartan each day.  Continue active lifestyle and low sodium diet.

## 2022-09-20 NOTE — Assessment & Plan Note (Signed)
Followed by neurosurgery.  

## 2022-09-20 NOTE — Assessment & Plan Note (Signed)
Doing well with sildenafil.  Will continue this at current strength.

## 2022-09-20 NOTE — Progress Notes (Signed)
Brett Garner - 67 y.o. male MRN [id number]  Date of birth: 07/30/1955  Subjective Chief Complaint  Patient presents with   Hypertension    HPI Brett Garner is a 67 y.o. male here today for follow up visit.   He reports that he is doing pretty well.   Continues on losartan for management of HTN.  He felt that his BP got too low with losartan 50mg  but was still high at 25mg .    He does check readings at home.  Remains quite active and feels that his diet is pretty good. Denies chest pain, shortness of breath, palpitations, headache or vision changes.    Seen by neurosurgery previously for cervical radiculopathy. Still with intermittent symptoms.    ROS:  A comprehensive ROS was completed and negative except as noted per HPI  Allergies  Allergen Reactions   Shellfish Allergy     Past Medical History:  Diagnosis Date   Spinal stenosis     Past Surgical History:  Procedure Laterality Date   LITHOTRIPSY Left     Social History   Socioeconomic History   Marital status: Married    Spouse name: Alan Ripper   Number of children: 0   Years of education: 18   Highest education level: Master's degree (e.g., MA, MS, MEng, MEd, MSW, MBA)  Occupational History   Occupation: Retired  Tobacco Use   Smoking status: Light Smoker    Types: Cigars   Smokeless tobacco: Never  Vaping Use   Vaping status: Never Used  Substance and Sexual Activity   Alcohol use: Yes   Drug use: Never   Sexual activity: Yes    Partners: Female    Birth control/protection: None  Other Topics Concern   Not on file  Social History Narrative   Lives with his spouse. Enjoys investing and staying mentally active.   Social Determinants of Health   Financial Resource Strain: Low Risk  (08/09/2022)   Overall Financial Resource Strain (CARDIA)    Difficulty of Paying Living Expenses: Not hard at all  Food Insecurity: No Food Insecurity (08/09/2022)   Hunger Vital Sign    Worried About Running Out of Food in  the Last Year: Never true    Ran Out of Food in the Last Year: Never true  Transportation Needs: No Transportation Needs (08/09/2022)   PRAPARE - Administrator, Civil Service (Medical): No    Lack of Transportation (Non-Medical): No  Physical Activity: Sufficiently Active (08/09/2022)   Exercise Vital Sign    Days of Exercise per Week: 7 days    Minutes of Exercise per Session: 60 min  Stress: No Stress Concern Present (08/09/2022)   [employer] of Occupational Health - Occupational Stress Questionnaire    Feeling of Stress : Not at all  Social Connections: Moderately Integrated (08/09/2022)   Social Connection and Isolation Panel [NHANES]    Frequency of Communication with Friends and Family: More than three times a week    Frequency of Social Gatherings with Friends and Family: Three times a week    Attends Religious Services: More than 4 times per year    Active Member of Clubs or Organizations: No    Attends Banker Meetings: Never    Marital Status: Married    Family History  Problem Relation Age of Onset   Hypertension Mother    Congestive Heart Failure Mother    Alcoholism Father     Health Maintenance  Topic Date Due  COVID-19 Vaccine (1) 03/12/2023 (Originally 03/08/1960)   Zoster Vaccines- Shingrix (1 of 2) 03/12/2023 (Originally 03/08/1974)   Pneumonia Vaccine 84+ Years old (1 of 2 - PCV) 03/23/2023 (Originally 03/08/1961)   Colonoscopy  09/20/2023 (Originally 03/08/2000)   Hepatitis C Screening  09/20/2023 (Originally 03/08/1973)   INFLUENZA VACCINE  09/21/2022   Medicare Annual Wellness (AWV)  08/09/2023   DTaP/Tdap/Td (3 - Td or Tdap) 03/22/2032   HPV VACCINES  Aged Out     ----------------------------------------------------------------------------------------------------------------------------------------------------------------------------------------------------------------- Physical Exam BP (!) 147/87 (BP Location: Left Arm,  Patient Position: Sitting, Cuff Size: Normal)   Pulse 71   Ht 5' 7.82" (1.723 m)   Wt 181 lb (82.1 kg)   SpO2 98%   BMI 27.67 kg/m   Physical Exam Constitutional:      Appearance: Normal appearance.  HENT:     Head: Normocephalic and atraumatic.  Cardiovascular:     Rate and Rhythm: Normal rate and regular rhythm.  Pulmonary:     Effort: Pulmonary effort is normal.     Breath sounds: Normal breath sounds.  Neurological:     Mental Status: He is alert.  Psychiatric:        Mood and Affect: Mood normal.        Behavior: Behavior normal.     ------------------------------------------------------------------------------------------------------------------------------------------------------------------------------------------------------------------- Assessment and Plan  Essential hypertension BP remains elevated at losartan 25mg , but feels that he gets too low at 50mg .  Will change to 1.5 tablets of losartan each day.  Continue active lifestyle and low sodium diet.   Cervical spinal stenosis Followed by neurosurgery.   Erectile dysfunction Doing well with sildenafil.  Will continue this at current strength.     Meds ordered this encounter  Medications   losartan (COZAAR) 25 MG tablet    Sig: Take 1.5 tablets (37.5 mg total) by mouth daily.    Dispense:  135 tablet    Refill:  1   sildenafil (VIAGRA) 100 MG tablet    Sig: Take 0.5-1 tablets (50-100 mg total) by mouth daily as needed for erectile dysfunction.    Dispense:  10 tablet    Refill:  11    Return in about 6 months (around 03/23/2023) for HTN/Fasting labs.    This visit occurred during the SARS-CoV-2 public health emergency.  Safety protocols were in place, including screening questions prior to the visit, additional usage of staff PPE, and extensive cleaning of exam room while observing appropriate contact time as indicated for disinfecting solutions.

## 2022-10-06 ENCOUNTER — Other Ambulatory Visit: Payer: Self-pay | Admitting: Family Medicine

## 2023-01-02 ENCOUNTER — Other Ambulatory Visit: Payer: Self-pay | Admitting: Family Medicine

## 2023-03-22 ENCOUNTER — Encounter: Payer: Self-pay | Admitting: Family Medicine

## 2023-03-22 ENCOUNTER — Ambulatory Visit (INDEPENDENT_AMBULATORY_CARE_PROVIDER_SITE_OTHER): Payer: Medicare Other | Admitting: Family Medicine

## 2023-03-22 VITALS — BP 142/81 | HR 66 | Ht 67.82 in | Wt 184.3 lb

## 2023-03-22 DIAGNOSIS — N529 Male erectile dysfunction, unspecified: Secondary | ICD-10-CM

## 2023-03-22 DIAGNOSIS — E559 Vitamin D deficiency, unspecified: Secondary | ICD-10-CM | POA: Diagnosis not present

## 2023-03-22 DIAGNOSIS — M4802 Spinal stenosis, cervical region: Secondary | ICD-10-CM

## 2023-03-22 DIAGNOSIS — Z13220 Encounter for screening for lipoid disorders: Secondary | ICD-10-CM | POA: Diagnosis not present

## 2023-03-22 DIAGNOSIS — I10 Essential (primary) hypertension: Secondary | ICD-10-CM

## 2023-03-22 NOTE — Assessment & Plan Note (Signed)
Doing well with sildenafil.  Will continue this at current strength.

## 2023-03-22 NOTE — Progress Notes (Signed)
Brett Garner - 68 y.o. male MRN [id number]  Date of birth: [birth year]  Subjective Chief Complaint  Patient presents with   Hypertension   Results    HPI Brett Garner is  68 y.o. male here today for follow up visit.   He is taking losartan 25mg , 1.5 tablets daily.  BP is slightly elevated on initial check.  He denies side effects.  He has not had chest pain, shortness of breath, palpitations, headache or vision changes.   Followed by neurosurgery for DDD of the neck.  Stable at this time.    Allergies  Allergen Reactions   Shellfish Allergy     Past Medical History:  Diagnosis Date   Spinal stenosis     Past Surgical History:  Procedure Laterality Date   LITHOTRIPSY Left     Social History   Socioeconomic History   Marital status: Married    Spouse name: Alan Ripper   Number of children: 0   Years of education: 18   Highest education level: Master's degree (e.g., MA, MS, MEng, MEd, MSW, MBA)  Occupational History   Occupation: Retired  Tobacco Use   Smoking status: Light Smoker    Types: Cigars   Smokeless tobacco: Never  Vaping Use   Vaping status: Never Used  Substance and Sexual Activity   Alcohol use: Yes   Drug use: Never   Sexual activity: Yes    Partners: Female    Birth control/protection: None  Other Topics Concern   Not on file  Social History Narrative   Lives with his spouse. Enjoys investing and staying mentally active.   Social Drivers of Corporate investment banker Strain: Low Risk  (08/09/2022)   Overall Financial Resource Strain (CARDIA)    Difficulty of Paying Living Expenses: Not hard at all  Food Insecurity: No Food Insecurity (08/09/2022)   Hunger Vital Sign    Worried About Running Out of Food in the Last Year: Never true    Ran Out of Food in the Last Year: Never true  Transportation Needs: No Transportation Needs (08/09/2022)   PRAPARE - Administrator, Civil Service (Medical): No    Lack of Transportation (Non-Medical):  No  Physical Activity: Sufficiently Active (08/09/2022)   Exercise Vital Sign    Days of Exercise per Week: 7 days    Minutes of Exercise per Session: 60 min  Stress: No Stress Concern Present (08/09/2022)   [employer] of Occupational Health - Occupational Stress Questionnaire    Feeling of Stress : Not at all  Social Connections: Moderately Integrated (08/09/2022)   Social Connection and Isolation Panel [NHANES]    Frequency of Communication with Friends and Family: More than three times a week    Frequency of Social Gatherings with Friends and Family: Three times a week    Attends Religious Services: More than 4 times per year    Active Member of Clubs or Organizations: No    Attends Banker Meetings: Never    Marital Status: Married    Family History  Problem Relation Age of Onset   Hypertension Mother    Congestive Heart Failure Mother    Alcoholism Father     Health Maintenance  Topic Date Due   Zoster Vaccines- Shingrix (1 of 2) Never done   Pneumonia Vaccine 1+ Years old (1 of 2 - PCV) 03/23/2023 (Originally 03/08/1961)   Colonoscopy  09/20/2023 (Originally 03/08/2000)   Hepatitis C Screening  09/20/2023 (Originally 03/08/1973)   COVID-19  Vaccine (1) 04/06/2024 (Originally 03/08/1960)   Medicare Annual Wellness (AWV)  08/09/2023   DTaP/Tdap/Td (3 - Td or Tdap) 03/22/2032   INFLUENZA VACCINE  Completed   HPV VACCINES  Aged Out     ----------------------------------------------------------------------------------------------------------------------------------------------------------------------------------------------------------------- Physical Exam BP (!) 142/81   Pulse 66   Ht 5' 7.82" (1.723 m)   Wt 184 lb 4.8 oz (83.6 kg)   SpO2 99%   BMI 28.17 kg/m   Physical  Exam  ------------------------------------------------------------------------------------------------------------------------------------------------------------------------------------------------------------------- Assessment and Plan  Cervical spinal stenosis He is seeing neurosurgery.  Stable at this time.  Plan to follow up in 1 year.    Vitamin D deficiency Update vitamin d levels.   Erectile dysfunction Doing well with sildenafil.  Will continue this at current strength.     No orders of the defined types were placed in this encounter.   Return in about 6 months (around 09/19/2023) for Hypertension.    This visit occurred during the SARS-CoV-2 public health emergency.  Safety protocols were in place, including screening questions prior to the visit, additional usage of staff PPE, and extensive cleaning of exam room while observing appropriate contact time as indicated for disinfecting solutions.

## 2023-03-22 NOTE — Assessment & Plan Note (Signed)
Update vitamin d levels.

## 2023-03-22 NOTE — Assessment & Plan Note (Signed)
He is seeing neurosurgery.  Stable at this time.  Plan to follow up in 1 year.

## 2023-03-23 ENCOUNTER — Encounter: Payer: Self-pay | Admitting: Family Medicine

## 2023-03-23 LAB — CBC WITH DIFFERENTIAL/PLATELET
Basophils Absolute: 0 10*3/uL (ref 0.0–0.2)
Basos: 1 %
EOS (ABSOLUTE): 0.2 10*3/uL (ref 0.0–0.4)
Eos: 4 %
Hematocrit: 46.1 % (ref 37.5–51.0)
Hemoglobin: 15.6 g/dL (ref 13.0–17.7)
Immature Grans (Abs): 0 10*3/uL (ref 0.0–0.1)
Immature Granulocytes: 0 %
Lymphocytes Absolute: 1.4 10*3/uL (ref 0.7–3.1)
Lymphs: 28 %
MCH: 30.7 pg (ref 26.6–33.0)
MCHC: 33.8 g/dL (ref 31.5–35.7)
MCV: 91 fL (ref 79–97)
Monocytes Absolute: 0.5 10*3/uL (ref 0.1–0.9)
Monocytes: 9 %
Neutrophils Absolute: 2.9 10*3/uL (ref 1.4–7.0)
Neutrophils: 58 %
Platelets: 211 10*3/uL (ref 150–450)
RBC: 5.08 x10E6/uL (ref 4.14–5.80)
RDW: 12.6 % (ref 11.6–15.4)
WBC: 4.9 10*3/uL (ref 3.4–10.8)

## 2023-03-23 LAB — CMP14+EGFR
ALT: 66 [IU]/L — ABNORMAL HIGH (ref 0–44)
AST: 35 [IU]/L (ref 0–40)
Albumin: 5.1 g/dL — ABNORMAL HIGH (ref 3.9–4.9)
Alkaline Phosphatase: 50 [IU]/L (ref 44–121)
BUN/Creatinine Ratio: 20 (ref 10–24)
BUN: 22 mg/dL (ref 8–27)
Bilirubin Total: 0.6 mg/dL (ref 0.0–1.2)
CO2: 23 mmol/L (ref 20–29)
Calcium: 9.9 mg/dL (ref 8.6–10.2)
Chloride: 101 mmol/L (ref 96–106)
Creatinine, Ser: 1.08 mg/dL (ref 0.76–1.27)
Globulin, Total: 2.2 g/dL (ref 1.5–4.5)
Glucose: 95 mg/dL (ref 70–99)
Potassium: 5 mmol/L (ref 3.5–5.2)
Sodium: 139 mmol/L (ref 134–144)
Total Protein: 7.3 g/dL (ref 6.0–8.5)
eGFR: 75 mL/min/{1.73_m2} (ref >59)

## 2023-03-23 LAB — LIPID PANEL WITH LDL/HDL RATIO
Cholesterol, Total: 189 mg/dL (ref 100–199)
HDL: 55 mg/dL (ref >39)
LDL Chol Calc (NIH): 116 mg/dL — ABNORMAL HIGH (ref 0–99)
LDL/HDL Ratio: 2.1 {ratio} (ref 0.0–3.6)
Triglycerides: 97 mg/dL (ref 0–149)
VLDL Cholesterol Cal: 18 mg/dL (ref 5–40)

## 2023-03-23 LAB — VITAMIN D 25 HYDROXY (VIT D DEFICIENCY, FRACTURES): Vit D, 25-Hydroxy: 83.8 ng/mL (ref 30.0–100.0)

## 2023-04-04 ENCOUNTER — Other Ambulatory Visit: Payer: Self-pay | Admitting: Family Medicine

## 2023-06-04 ENCOUNTER — Ambulatory Visit (INDEPENDENT_AMBULATORY_CARE_PROVIDER_SITE_OTHER): Admitting: Family Medicine

## 2023-06-04 ENCOUNTER — Encounter: Payer: Self-pay | Admitting: Family Medicine

## 2023-06-04 VITALS — BP 135/69 | HR 92 | Ht 67.82 in | Wt 180.0 lb

## 2023-06-04 DIAGNOSIS — J329 Chronic sinusitis, unspecified: Secondary | ICD-10-CM | POA: Insufficient documentation

## 2023-06-04 DIAGNOSIS — J40 Bronchitis, not specified as acute or chronic: Secondary | ICD-10-CM

## 2023-06-04 MED ORDER — DOXYCYCLINE HYCLATE 100 MG PO TABS: 100.0000 mg | ORAL_TABLET | Freq: Two times a day (BID) | ORAL | 0 refills | Status: DC

## 2023-06-04 MED ORDER — PREDNISONE 20 MG PO TABS: 20.0000 mg | ORAL_TABLET | Freq: Two times a day (BID) | ORAL | 0 refills | Status: AC

## 2023-06-04 NOTE — Progress Notes (Signed)
 Michela Aguas - 68 y.o. male MRN [id number]  Date of birth: 04/06/1955  Subjective Chief Complaint  Patient presents with   Cough    HPI Eryk Beavers is a 68 y.o. male here today with complaint of cough.    Cough started about 3 days ago.  Feels like he may have some wheezing/bronchospasm with coughing.  Cough is non-productive.  He does have sore throat from coughing.  He does have some associated congestion.  Denies fever or chills.  Tried mucinex but noted some blood in his urine after using this.   ROS:  A comprehensive ROS was completed and negative except as noted per HPI  Allergies  Allergen Reactions   Shellfish Allergy     Past Medical History:  Diagnosis Date   Spinal stenosis     Past Surgical History:  Procedure Laterality Date   LITHOTRIPSY Left     Social History   Socioeconomic History   Marital status: Married    Spouse name: Anastacio Balm   Number of children: 0   Years of education: 18   Highest education level: Master's degree (e.g., MA, MS, MEng, MEd, MSW, MBA)  Occupational History   Occupation: Retired  Tobacco Use   Smoking status: Light Smoker    Types: Cigars   Smokeless tobacco: Never  Vaping Use   Vaping status: Never Used  Substance and Sexual Activity   Alcohol use: Yes   Drug use: Never   Sexual activity: Yes    Partners: Female    Birth control/protection: None  Other Topics Concern   Not on file  Social History Narrative   Lives with his spouse. Enjoys investing and staying mentally active.   Social Drivers of Corporate investment banker Strain: Low Risk  (08/09/2022)   Overall Financial Resource Strain (CARDIA)    Difficulty of Paying Living Expenses: Not hard at all  Food Insecurity: No Food Insecurity (08/09/2022)   Hunger Vital Sign    Worried About Running Out of Food in the Last Year: Never true    Ran Out of Food in the Last Year: Never true  Transportation Needs: No Transportation Needs (08/09/2022)   PRAPARE -  Administrator, Civil Service (Medical): No    Lack of Transportation (Non-Medical): No  Physical Activity: Sufficiently Active (08/09/2022)   Exercise Vital Sign    Days of Exercise per Week: 7 days    Minutes of Exercise per Session: 60 min  Stress: No Stress Concern Present (08/09/2022)   [employer] of Occupational Health - Occupational Stress Questionnaire    Feeling of Stress : Not at all  Social Connections: Moderately Integrated (08/09/2022)   Social Connection and Isolation Panel [NHANES]    Frequency of Communication with Friends and Family: More than three times a week    Frequency of Social Gatherings with Friends and Family: Three times a week    Attends Religious Services: More than 4 times per year    Active Member of Clubs or Organizations: No    Attends Banker Meetings: Never    Marital Status: Married    Family History  Problem Relation Age of Onset   Hypertension Mother    Congestive Heart Failure Mother    Alcoholism Father     Health Maintenance  Topic Date Due   Pneumonia Vaccine 45+ Years old (1 of 2 - PCV) Never done   Zoster Vaccines- Shingrix (1 of 2) Never done   Colonoscopy  09/20/2023 (Originally  03/08/2000)   Hepatitis C Screening  09/20/2023 (Originally 03/08/1973)   COVID-19 Vaccine (1) 04/06/2024 (Originally 03/08/1960)   Medicare Annual Wellness (AWV)  08/09/2023   INFLUENZA VACCINE  09/21/2023   DTaP/Tdap/Td (3 - Td or Tdap) 03/22/2032   HPV VACCINES  Aged Out   Meningococcal B Vaccine  Aged Out     ----------------------------------------------------------------------------------------------------------------------------------------------------------------------------------------------------------------- Physical Exam BP 135/69 (BP Location: Left Arm, Patient Position: Sitting, Cuff Size: Normal)   Pulse 92   Ht 5' 7.82" (1.723 m)   Wt 180 lb (81.6 kg)   SpO2 95%   BMI 27.51 kg/m   Physical  Exam Constitutional:      Appearance: Normal appearance.  HENT:     Head: Normocephalic and atraumatic.     Nose: Nose normal.     Comments: Congested Eyes:     General: No scleral icterus. Cardiovascular:     Rate and Rhythm: Normal rate and regular rhythm.  Pulmonary:     Effort: Pulmonary effort is normal.     Breath sounds: Normal breath sounds.  Neurological:     Mental Status: He is alert.     ------------------------------------------------------------------------------------------------------------------------------------------------------------------------------------------------------------------- Assessment and Plan  Sinobronchitis Treating aggressively with combination of doxycyline and prednisone 20 BID.  Push fluids. Contact clinic if not improving.    Meds ordered this encounter  Medications   doxycycline (VIBRA-TABS) 100 MG tablet    Sig: Take 1 tablet (100 mg total) by mouth 2 (two) times daily.    Dispense:  20 tablet    Refill:  0   predniSONE (DELTASONE) 20 MG tablet    Sig: Take 1 tablet (20 mg total) by mouth 2 (two) times daily with a meal for 5 days.    Dispense:  10 tablet    Refill:  0    No follow-ups on file.

## 2023-06-04 NOTE — Assessment & Plan Note (Signed)
 Treating aggressively with combination of doxycyline and prednisone 20 BID.  Push fluids. Contact clinic if not improving.

## 2023-06-17 ENCOUNTER — Encounter: Payer: Self-pay | Admitting: Family Medicine

## 2023-06-18 MED ORDER — DOXYCYCLINE HYCLATE 100 MG PO TABS: 100.0000 mg | ORAL_TABLET | Freq: Two times a day (BID) | ORAL | 0 refills | Status: DC

## 2023-08-16 ENCOUNTER — Ambulatory Visit

## 2023-08-16 VITALS — Ht 69.0 in | Wt 180.0 lb

## 2023-08-16 DIAGNOSIS — Z0000 Encounter for general adult medical examination without abnormal findings: Secondary | ICD-10-CM | POA: Diagnosis not present

## 2023-08-16 NOTE — Progress Notes (Signed)
 Subjective:   Brett Garner is a 68 y.o. male who presents for Medicare Annual/Subsequent preventive examination.  Visit Complete: Virtual I connected with  Brett Garner on 08/16/23 by a audio enabled telemedicine application and verified that I am speaking with the correct person using two identifiers.  Patient Location: Home  Provider Location: Office/Clinic  I discussed the limitations of evaluation and management by telemedicine. The patient expressed understanding and agreed to proceed.  Vital Signs: Because this visit was a virtual/telehealth visit, some criteria may be missing or patient reported. Any vitals not documented were not able to be obtained and vitals that have been documented are patient reported.  Patient Medicare AWV questionnaire was completed by the patient on n/a; I have confirmed that all information answered by patient is correct and no changes since this date.  Cardiac Risk Factors include: advanced age (>72men, >71 women);male gender;hypertension;smoking/ tobacco exposure;dyslipidemia     Objective:    Today's Vitals   08/16/23 1058  Weight: 180 lb (81.6 kg)  Height: 5' 9 (1.753 m)   Body mass index is 26.58 kg/m.     08/16/2023   11:05 AM 08/09/2022   10:04 AM 10/20/2020    2:52 PM 04/01/2019    9:49 AM  Advanced Directives  Does Patient Have a Medical Advance Directive? Yes No No No  Type of Estate agent of [place];Living will     Copy of Healthcare Power of Attorney in Chart? No - copy requested     Would patient like information on creating a medical advance directive?  No - Patient declined No - Patient declined No - Patient declined    Current Medications (verified) Outpatient Encounter Medications as of 08/16/2023  Medication Sig   losartan  (COZAAR ) 25 MG tablet TAKE 1.5 TABLETS BY MOUTH DAILY.   sildenafil  (VIAGRA ) 100 MG tablet Take 0.5-1 tablets (50-100 mg total) by mouth daily as needed for erectile dysfunction.    UNABLE TO FIND Med Name: Sleep 3   [DISCONTINUED] doxycycline  (VIBRA -TABS) 100 MG tablet Take 1 tablet (100 mg total) by mouth 2 (two) times daily.   No facility-administered encounter medications on file as of 08/16/2023.    Allergies (verified) Shellfish allergy   History: Past Medical History:  Diagnosis Date   Spinal stenosis    Past Surgical History:  Procedure Laterality Date   LITHOTRIPSY Left    Family History  Problem Relation Age of Onset   Hypertension Mother    Congestive Heart Failure Mother    Alcoholism Father    Social History   Socioeconomic History   Marital status: Married    Spouse name: Brett Garner   Number of children: 0   Years of education: 18   Highest education level: Master's degree (e.g., MA, MS, MEng, MEd, MSW, MBA)  Occupational History   Occupation: Retired  Tobacco Use   Smoking status: Light Smoker    Types: Cigars   Smokeless tobacco: Never  Vaping Use   Vaping status: Never Used  Substance and Sexual Activity   Alcohol use: Yes   Drug use: Never   Sexual activity: Yes    Partners: Female    Birth control/protection: None  Other Topics Concern   Not on file  Social History Narrative   Lives with his spouse. Enjoys investing and staying mentally active.   Social Drivers of Health   Financial Resource Strain: Low Risk  (08/16/2023)   Overall Financial Resource Strain (CARDIA)    Difficulty of Paying Living  Expenses: Not hard at all  Food Insecurity: No Food Insecurity (08/16/2023)   Hunger Vital Sign    Worried About Running Out of Food in the Last Year: Never true    Ran Out of Food in the Last Year: Never true  Transportation Needs: No Transportation Needs (08/16/2023)   PRAPARE - Administrator, Civil Service (Medical): No    Lack of Transportation (Non-Medical): No  Physical Activity: Sufficiently Active (08/16/2023)   Exercise Vital Sign    Days of Exercise per Week: 5 days    Minutes of Exercise per Session:  90 min  Stress: No Stress Concern Present (08/16/2023)   Brett Garner of Occupational Health - Occupational Stress Questionnaire    Feeling of Stress: Not at all  Social Connections: Moderately Integrated (08/16/2023)   Social Connection and Isolation Panel    Frequency of Communication with Friends and Family: More than three times a week    Frequency of Social Gatherings with Friends and Family: More than three times a week    Attends Religious Services: More than 4 times per year    Active Member of [employer] or Organizations: No    Attends Engineer, structural: Never    Marital Status: Married    Tobacco Counseling Ready to quit: Not Answered Counseling given: Not Answered   Clinical Intake:     Pain : No/denies pain     BMI - recorded: 26.58 Nutritional Status: BMI 25 -29 Overweight Nutritional Risks: None Diabetes: No  How often do you need to have someone help you when you read instructions, pamphlets, or other written materials from your doctor or pharmacy?: 1 - Never What is the last grade level you completed in school?: 18  Interpreter Needed?: No      Activities of Daily Living    08/16/2023   10:59 AM  In your present state of health, do you have any difficulty performing the following activities:  Hearing? 0  Vision? 0  Difficulty concentrating or making decisions? 0  Walking or climbing stairs? 0  Dressing or bathing? 0  Doing errands, shopping? 0  Preparing Food and eating ? N  Using the Toilet? N  In the past six months, have you accidently leaked urine? N  Do you have problems with loss of bowel control? N  Managing your Medications? N  Managing your Finances? N  Housekeeping or managing your Housekeeping? N    Patient Care Team: Brett Bring, DO as PCP - General (Family Medicine)  Indicate any recent Medical Services you may have received from other than [facility] providers in the past year (date may be approximate).      Assessment:   This is a routine wellness examination for Brett Garner.  Hearing/Vision screen No results found.   Goals Addressed             This Visit's Progress    Patient Stated       Patient states he would like to continue current healthy lifestyle.        Depression Screen    08/16/2023   11:04 AM 03/22/2023    8:31 AM 09/20/2022    8:16 AM 08/09/2022   10:04 AM 03/22/2022    8:13 AM 12/08/2021   11:52 AM 09/13/2021    8:17 AM  PHQ 2/9 Scores  PHQ - 2 Score 0 0 0 0 0 0 0    Fall Risk    08/16/2023   11:06 AM 03/22/2023  8:31 AM 09/20/2022    8:17 AM 08/09/2022   10:04 AM 03/22/2022    8:13 AM  Fall Risk   Falls in the past year? 0 0 0 0 0  Number falls in past yr: 0 0 0 0 0  Injury with Fall? 0 0 0 0 0  Risk for fall due to : No Fall Risks No Fall Risks No Fall Risks No Fall Risks No Fall Risks  Follow up Falls evaluation completed Falls evaluation completed Falls evaluation completed Falls evaluation completed Falls evaluation completed    MEDICARE RISK AT HOME: Medicare Risk at Home Any stairs in or around the home?: Yes If so, are there any without handrails?: Yes Home free of loose throw rugs in walkways, pet beds, electrical cords, etc?: Yes Adequate lighting in your home to reduce risk of falls?: Yes Life alert?: No Use of a cane, walker or w/c?: No Grab bars in the bathroom?: No Shower chair or bench in shower?: No Elevated toilet seat or a handicapped toilet?: Yes  TIMED UP AND GO:  Was the test performed?  No    Cognitive Function:        08/16/2023   11:07 AM 08/09/2022   10:09 AM  6CIT Screen  What Year? 0 points 0 points  What month? 0 points 0 points  What time? 0 points 0 points  Count back from 20 0 points 0 points  Months in reverse 0 points 0 points  Repeat phrase 0 points 0 points  Total Score 0 points 0 points    Immunizations Immunization History  Administered Date(s) Administered   Fluad Quad(high Dose 65+) 10/21/2020,  11/08/2021   Fluad Trivalent(High Dose 65+) 11/03/2022   Influenza-Unspecified 12/22/2018   Tdap 02/20/2005, 03/22/2022    TDAP status: Up to date  Flu Vaccine status: Up to date  Pneumococcal vaccine status: Declined,  Education has been provided regarding the importance of this vaccine but patient still declined. Advised may receive this vaccine at local pharmacy or Health Dept. Aware to provide a copy of the vaccination record if obtained from local pharmacy or Health Dept. Verbalized acceptance and understanding.   Covid-19 vaccine status: Declined, Education has been provided regarding the importance of this vaccine but patient still declined. Advised may receive this vaccine at local pharmacy or Health Dept.or vaccine clinic. Aware to provide a copy of the vaccination record if obtained from local pharmacy or Health Dept. Verbalized acceptance and understanding.  Qualifies for Shingles Vaccine? Yes   Zostavax completed No   Shingrix Completed?: No.    Education has been provided regarding the importance of this vaccine. Patient has been advised to call insurance company to determine out of pocket expense if they have not yet received this vaccine. Advised may also receive vaccine at local pharmacy or Health Dept. Verbalized acceptance and understanding.  Screening Tests Health Maintenance  Topic Date Due   Pneumococcal Vaccine: 50+ Years (1 of 2 - PCV) Never done   Zoster Vaccines- Shingrix (1 of 2) Never done   Colonoscopy  09/20/2023 (Originally 03/08/2000)   Hepatitis C Screening  09/20/2023 (Originally 03/08/1973)   COVID-19 Vaccine (1) 04/06/2024 (Originally [birth year]/08/1960)   INFLUENZA VACCINE  09/21/2023   Medicare Annual Wellness (AWV)  08/15/2024   DTaP/Tdap/Td (3 - Td or Tdap) 03/22/2032   Hepatitis B Vaccines  Aged Out   HPV VACCINES  Aged Out   Meningococcal B Vaccine  Aged Out    Health Maintenance  Health Maintenance Due  Topic Date Due   Pneumococcal Vaccine: 50+  Years (1 of 2 - PCV) Never done   Zoster Vaccines- Shingrix (1 of 2) Never done    Colorectal cancer screening. Patient declined.   Lung Cancer Screening: (Low Dose CT Chest recommended if Age 96-80 years, 20 pack-year currently smoking OR have quit w/in 15years.) does not qualify.   Lung Cancer Screening Referral: n/a  Additional Screening:  Hepatitis C Screening: does qualify; Completed   Vision Screening: Recommended annual ophthalmology exams for early detection of glaucoma and other disorders of the eye. Is the patient up to date with their annual eye exam?  Yes  Who is the provider or what is the name of the office in which the patient attends annual eye exams? [facility] If pt is not established with a provider, would they like to be referred to a provider to establish care? N/a.   Dental Screening: Recommended annual dental exams for proper oral hygiene   Community Resource Referral / Chronic Care Management: CRR required this visit?  No   CCM required this visit?  No     Plan:     I have personally reviewed and noted the following in the patient's chart:   Medical and social history Use of alcohol, tobacco or illicit drugs  Current medications and supplements including opioid prescriptions. Patient is not currently taking opioid prescriptions. Functional ability and status Nutritional status Physical activity Advanced directives List of other physicians Hospitalizations, surgeries, and ER visits in previous 12 months Vitals Screenings to include cognitive, depression, and falls Referrals and appointments  In addition, I have reviewed and discussed with patient certain preventive protocols, quality metrics, and best practice recommendations. A written personalized care plan for preventive services as well as general preventive health recommendations were provided to patient.     Brett Garner, CMA   08/16/2023   After Visit Summary: (MyChart) Due  to this being a telephonic visit, the after visit summary with patients personalized plan was offered to patient via MyChart   Nurse Notes:   Brett Garner is a 68 y.o. male patient of Brett Bring, DO who had a Medicare Annual Wellness Visit today via telephone. Brett Garner is Retired and lives with their spouse. he does not have any children. He reports that he is socially active and does interact with friends/family regularly. He is physically active.

## 2023-08-16 NOTE — Patient Instructions (Addendum)
  [name] , Thank you for taking time to come for your Medicare Wellness Visit. I appreciate your ongoing commitment to your health goals. Please review the following plan we discussed and let me know if I can assist you in the future.   These are the goals we discussed:  Goals       Patient Stated (pt-stated)      Patient stated that he would like to continue to maintain his current lifestyle.      Patient Stated      Patient states he would like to continue current healthy lifestyle.         This is a list of the screening recommended for you and due dates:  Health Maintenance  Topic Date Due   Pneumococcal Vaccine for age over 50 (1 of 2 - PCV) Never done   Zoster (Shingles) Vaccine (1 of 2) Never done   Colon Cancer Screening  09/20/2023*   Hepatitis C Screening  09/20/2023*   COVID-19 Vaccine (1) 04/06/2024*   Flu Shot  09/21/2023   Medicare Annual Wellness Visit  08/15/2024   DTaP/Tdap/Td vaccine (3 - Td or Tdap) 03/22/2032   Hepatitis B Vaccine  Aged Out   HPV Vaccine  Aged Out   Meningitis B Vaccine  Aged Out  *Topic was postponed. The date shown is not the original due date.

## 2023-09-20 ENCOUNTER — Ambulatory Visit: Payer: Medicare Other | Admitting: Family Medicine

## 2023-09-30 ENCOUNTER — Other Ambulatory Visit: Payer: Self-pay | Admitting: Family Medicine

## 2023-10-12 ENCOUNTER — Ambulatory Visit (INDEPENDENT_AMBULATORY_CARE_PROVIDER_SITE_OTHER): Admitting: Family Medicine

## 2023-10-12 ENCOUNTER — Encounter: Payer: Self-pay | Admitting: Family Medicine

## 2023-10-12 VITALS — BP 137/74 | HR 76 | Ht 69.0 in | Wt 177.0 lb

## 2023-10-12 DIAGNOSIS — N529 Male erectile dysfunction, unspecified: Secondary | ICD-10-CM

## 2023-10-12 DIAGNOSIS — Z1211 Encounter for screening for malignant neoplasm of colon: Secondary | ICD-10-CM | POA: Diagnosis not present

## 2023-10-12 DIAGNOSIS — I10 Essential (primary) hypertension: Secondary | ICD-10-CM | POA: Diagnosis not present

## 2023-10-12 MED ORDER — SILDENAFIL CITRATE 100 MG PO TABS: 50.0000 mg | ORAL_TABLET | Freq: Every day | ORAL | 11 refills | Status: AC | PRN

## 2023-10-12 MED ORDER — LOSARTAN POTASSIUM 25 MG PO TABS: ORAL_TABLET | ORAL | 3 refills | Status: AC

## 2023-10-12 NOTE — Assessment & Plan Note (Signed)
 BP is well controlled at this time.  Will plan to continue losartan  at current strength.

## 2023-10-12 NOTE — Assessment & Plan Note (Signed)
 Doing well with sildenafil.  Will continue this at current strength.

## 2023-10-12 NOTE — Progress Notes (Signed)
 Brett Garner - 68 y.o. male MRN [id number]  Date of birth: 1955-03-01  Subjective Chief Complaint  Patient presents with   Hypertension    HPI Brett Garner is a 68 y.o. male here today for follow up visit.   He reports that he is doing well.  Remains on losartan  for management of HTN.  BP is well controlled today.  He denies side effects from medicaiton at current strength.  He has not had chest pain, shortness of breath, palpitations, headache or vision changes.   He continues to exercise regularly and feels that his diet is pretty good.   ROS:  A comprehensive ROS was completed and negative except as noted per HPI  Allergies  Allergen Reactions   Shellfish Allergy     Past Medical History:  Diagnosis Date   Spinal stenosis     Past Surgical History:  Procedure Laterality Date   LITHOTRIPSY Left     Social History   Socioeconomic History   Marital status: Married    Spouse name: Estefana   Number of children: 0   Years of education: 18   Highest education level: Master's degree (e.g., MA, MS, MEng, MEd, MSW, MBA)  Occupational History   Occupation: Retired  Tobacco Use   Smoking status: Light Smoker    Types: Cigars   Smokeless tobacco: Never  Vaping Use   Vaping status: Never Used  Substance and Sexual Activity   Alcohol use: Yes   Drug use: Never   Sexual activity: Yes    Partners: Female    Birth control/protection: None  Other Topics Concern   Not on file  Social History Narrative   Lives with his spouse. Enjoys investing and staying mentally active.   Social Drivers of Corporate investment banker Strain: Low Risk  (08/16/2023)   Overall Financial Resource Strain (CARDIA)    Difficulty of Paying Living Expenses: Not hard at all  Food Insecurity: No Food Insecurity (08/16/2023)   Hunger Vital Sign    Worried About Running Out of Food in the Last Year: Never true    Ran Out of Food in the Last Year: Never true  Transportation Needs: No Transportation  Needs (08/16/2023)   PRAPARE - Administrator, Civil Service (Medical): No    Lack of Transportation (Non-Medical): No  Physical Activity: Sufficiently Active (08/16/2023)   Exercise Vital Sign    Days of Exercise per Week: 5 days    Minutes of Exercise per Session: 90 min  Stress: No Stress Concern Present (08/16/2023)   [employer] of Occupational Health - Occupational Stress Questionnaire    Feeling of Stress: Not at all  Social Connections: Moderately Integrated (08/16/2023)   Social Connection and Isolation Panel    Frequency of Communication with Friends and Family: More than three times a week    Frequency of Social Gatherings with Friends and Family: More than three times a week    Attends Religious Services: More than 4 times per year    Active Member of [employer] or Organizations: No    Attends Banker Meetings: Never    Marital Status: Married    Family History  Problem Relation Age of Onset   Hypertension Mother    Congestive Heart Failure Mother    Alcoholism Father     Health Maintenance  Topic Date Due   Hepatitis C Screening  Never done   Fecal DNA (Cologuard)  Never done   INFLUENZA VACCINE  09/21/2023  Zoster Vaccines- Shingrix (1 of 2) 01/12/2024 (Originally 03/08/1974)   COVID-19 Vaccine (1) 04/06/2024 (Originally [birth year]/08/1960)   Pneumococcal Vaccine: 50+ Years (1 of 2 - PCV) 10/11/2024 (Originally 03/08/1974)   Medicare Annual Wellness (AWV)  08/15/2024   DTaP/Tdap/Td (3 - Td or Tdap) 03/22/2032   HPV VACCINES  Aged Out   Meningococcal B Vaccine  Aged Out     ----------------------------------------------------------------------------------------------------------------------------------------------------------------------------------------------------------------- Physical Exam BP 137/74 (BP Location: Left Arm, Patient Position: Sitting, Cuff Size: Normal)   Pulse 76   Ht 5' 9 (1.753 m)   Wt 177 lb (80.3 kg)   SpO2 97%   BMI  26.14 kg/m   Physical Exam Constitutional:      Appearance: Normal appearance.  Eyes:     General: No scleral icterus. Cardiovascular:     Rate and Rhythm: Normal rate and regular rhythm.  Pulmonary:     Effort: Pulmonary effort is normal.     Breath sounds: Normal breath sounds.  Neurological:     General: No focal deficit present.     Mental Status: He is alert.  Psychiatric:        Mood and Affect: Mood normal.        Behavior: Behavior normal.     ------------------------------------------------------------------------------------------------------------------------------------------------------------------------------------------------------------------- Assessment and Plan  Essential hypertension BP is well controlled at this time.  Will plan to continue losartan  at current strength.    Erectile dysfunction Doing well with sildenafil .  Will continue this at current strength.     Meds ordered this encounter  Medications   losartan  (COZAAR ) 25 MG tablet    Sig: TAKE 1.5 TABLETS BY MOUTH DAILY.    Dispense:  135 tablet    Refill:  3   sildenafil  (VIAGRA ) 100 MG tablet    Sig: Take 0.5-1 tablets (50-100 mg total) by mouth daily as needed for erectile dysfunction.    Dispense:  10 tablet    Refill:  11    Return in about 5 months (around 03/13/2024) for Hypertension.

## 2023-10-23 ENCOUNTER — Encounter: Payer: Self-pay | Admitting: Sports Medicine

## 2023-11-10 LAB — COLOGUARD: COLOGUARD: NEGATIVE

## 2023-11-11 ENCOUNTER — Ambulatory Visit: Payer: Self-pay | Admitting: Family Medicine

## 2024-03-14 ENCOUNTER — Encounter: Payer: Self-pay | Admitting: Family Medicine

## 2024-03-14 ENCOUNTER — Ambulatory Visit (INDEPENDENT_AMBULATORY_CARE_PROVIDER_SITE_OTHER): Admitting: Family Medicine

## 2024-03-14 VITALS — BP 130/78 | HR 59 | Ht 69.0 in | Wt 180.0 lb

## 2024-03-14 DIAGNOSIS — M4802 Spinal stenosis, cervical region: Secondary | ICD-10-CM

## 2024-03-14 DIAGNOSIS — I10 Essential (primary) hypertension: Secondary | ICD-10-CM | POA: Diagnosis not present

## 2024-03-14 DIAGNOSIS — E559 Vitamin D deficiency, unspecified: Secondary | ICD-10-CM | POA: Diagnosis not present

## 2024-03-14 DIAGNOSIS — N529 Male erectile dysfunction, unspecified: Secondary | ICD-10-CM

## 2024-03-14 DIAGNOSIS — Z13220 Encounter for screening for lipoid disorders: Secondary | ICD-10-CM | POA: Diagnosis not present

## 2024-03-14 NOTE — Assessment & Plan Note (Signed)
 BP is well controlled at this time.  Will plan to continue losartan  at current strength.

## 2024-03-14 NOTE — Assessment & Plan Note (Signed)
 Doing well with sildenafil.  Will continue this at current strength.

## 2024-03-14 NOTE — Progress Notes (Signed)
 " Brett Garner - 69 y.o. male MRN [id number]  Date of birth: [birth year]  Subjective Chief Complaint  Patient presents with   Annual Exam    HPI Brett Garner is a 69 y.o. male here today for annual exam/follow up  He reports that he is doing well.   BP remains well controlled with losartan  at current strength.  No side effects at this time.  He has not had chest pain, shortness of breath, palpitations, headache or vision changes.   He remains moderately active.  He feels that he is doing well with diet.   Non-smoker. Occasional EtOH.   Review of Systems  Constitutional:  Negative for chills, fever, malaise/fatigue and weight loss.  HENT:  Negative for congestion, ear pain and sore throat.   Eyes:  Negative for blurred vision, double vision and pain.  Respiratory:  Negative for cough and shortness of breath.   Cardiovascular:  Negative for chest pain and palpitations.  Gastrointestinal:  Negative for abdominal pain, blood in stool, constipation, heartburn and nausea.  Genitourinary:  Negative for dysuria and urgency.  Musculoskeletal:  Negative for joint pain and myalgias.  Neurological:  Negative for dizziness and headaches.  Endo/Heme/Allergies:  Does not bruise/bleed easily.  Psychiatric/Behavioral:  Negative for depression. The patient is not nervous/anxious and does not have insomnia.     Allergies[1]  Past Medical History:  Diagnosis Date   Spinal stenosis     Past Surgical History:  Procedure Laterality Date   LITHOTRIPSY Left     Social History   Socioeconomic History   Marital status: Married    Spouse name: Estefana   Number of children: 0   Years of education: 18   Highest education level: Master's degree (e.g., MA, MS, MEng, MEd, MSW, MBA)  Occupational History   Occupation: Retired  Tobacco Use   Smoking status: Light Smoker    Types: Cigars   Smokeless tobacco: Never  Vaping Use   Vaping status: Never Used  Substance and Sexual Activity   Alcohol  use: Yes   Drug use: Never   Sexual activity: Yes    Partners: Female    Birth control/protection: None  Other Topics Concern   Not on file  Social History Narrative   Lives with his spouse. Enjoys investing and staying mentally active.   Social Drivers of Health   Tobacco Use: High Risk (03/14/2024)   Patient History    Smoking Tobacco Use: Light Smoker    Smokeless Tobacco Use: Never    Passive Exposure: Not on file  Financial Resource Strain: Low Risk (08/16/2023)   Overall Financial Resource Strain (CARDIA)    Difficulty of Paying Living Expenses: Not hard at all  Food Insecurity: No Food Insecurity (08/16/2023)   Epic    Worried About Radiation Protection Practitioner of Food in the Last Year: Never true    Ran Out of Food in the Last Year: Never true  Transportation Needs: No Transportation Needs (08/16/2023)   Epic    Lack of Transportation (Medical): No    Lack of Transportation (Non-Medical): No  Physical Activity: Sufficiently Active (08/16/2023)   Exercise Vital Sign    Days of Exercise per Week: 5 days    Minutes of Exercise per Session: 90 min  Stress: No Stress Concern Present (08/16/2023)   [employer] of Occupational Health - Occupational Stress Questionnaire    Feeling of Stress: Not at all  Social Connections: Moderately Integrated (08/16/2023)   Social Connection and Isolation Panel  Frequency of Communication with Friends and Family: More than three times a week    Frequency of Social Gatherings with Friends and Family: More than three times a week    Attends Religious Services: More than 4 times per year    Active Member of Clubs or Organizations: No    Attends Banker Meetings: Never    Marital Status: Married  Depression (PHQ2-9): Low Risk (03/14/2024)   Depression (PHQ2-9)    PHQ-2 Score: 0  Alcohol Screen: Low Risk (08/16/2023)   Alcohol Screen    Last Alcohol Screening Score (AUDIT): 1  Housing: Low Risk (08/16/2023)   Epic    Unable to Pay for  Housing in the Last Year: No    Number of Times Moved in the Last Year: 0    Homeless in the Last Year: No  Utilities: Not At Risk (08/16/2023)   Epic    Threatened with loss of utilities: No  Health Literacy: Adequate Health Literacy (08/16/2023)   B1300 Health Literacy    Frequency of need for help with medical instructions: Never    Family History  Problem Relation Age of Onset   Hypertension Mother    Congestive Heart Failure Mother    Alcoholism Father     Health Maintenance  Topic Date Due   COVID-19 Vaccine (1) 04/06/2024 (Originally 09/06/1955)   Zoster Vaccines- Shingrix (1 of 2) 06/12/2024 (Originally 03/08/1974)   Pneumococcal Vaccine: 50+ Years (1 of 2 - PCV) 10/11/2024 (Originally 03/08/1974)   Hepatitis C Screening  03/14/2025 (Originally 03/08/1973)   Medicare Annual Wellness (AWV)  08/15/2024   Fecal DNA (Cologuard)  10/31/2026   DTaP/Tdap/Td (3 - Td or Tdap) 03/22/2032   Influenza Vaccine  Completed   Meningococcal B Vaccine  Aged Out     ----------------------------------------------------------------------------------------------------------------------------------------------------------------------------------------------------------------- Physical Exam BP 130/78 (BP Location: Left Arm, Patient Position: Sitting, Cuff Size: Normal)   Pulse (!) 59   Ht 5' 9 (1.753 m)   Wt 180 lb (81.6 kg)   SpO2 97%   BMI 26.58 kg/m   Physical Exam Constitutional:      General: He is not in acute distress. HENT:     Head: Normocephalic and atraumatic.     Right Ear: Tympanic membrane and external ear normal.     Left Ear: Tympanic membrane and external ear normal.  Eyes:     General: No scleral icterus. Neck:     Thyroid: No thyromegaly.  Cardiovascular:     Rate and Rhythm: Normal rate and regular rhythm.     Heart sounds: Normal heart sounds.  Pulmonary:     Effort: Pulmonary effort is normal.     Breath sounds: Normal breath sounds.  Abdominal:      General: Bowel sounds are normal. There is no distension.     Palpations: Abdomen is soft.     Tenderness: There is no abdominal tenderness. There is no guarding.  Musculoskeletal:     Cervical back: Normal range of motion.  Lymphadenopathy:     Cervical: No cervical adenopathy.  Skin:    General: Skin is warm and dry.     Findings: No rash.  Neurological:     Mental Status: He is alert and oriented to person, place, and time.     Cranial Nerves: No cranial nerve deficit.     Motor: No abnormal muscle tone.  Psychiatric:        Mood and Affect: Mood normal.        Behavior: Behavior  normal.     ------------------------------------------------------------------------------------------------------------------------------------------------------------------------------------------------------------------- Assessment and Plan  Essential hypertension BP is well controlled at this time.  Will plan to continue losartan  at current strength.    Erectile dysfunction Doing well with sildenafil .  Will continue this at current strength.    Cervical spinal stenosis He is seeing neurosurgery.  Stable at this time.  Plan to follow up in 1 year.     No orders of the defined types were placed in this encounter.   Return in about 6 months (around 09/11/2024) for Hypertension.         [1]  Allergies Allergen Reactions   Shellfish Allergy    "

## 2024-03-14 NOTE — Patient Instructions (Signed)
 Preventive Care 73 Years and Older, Male Preventive care refers to lifestyle choices and visits with your health care provider that can promote health and wellness. Preventive care visits are also called wellness exams. What can I expect for my preventive care visit? Counseling During your preventive care visit, your health care provider may ask about your: Medical history, including: Past medical problems. Family medical history. History of falls. Current health, including: Emotional well-being. Home life and relationship well-being. Sexual activity. Memory and ability to understand (cognition). Lifestyle, including: Alcohol, nicotine or tobacco, and drug use. Access to firearms. Diet, exercise, and sleep habits. Work and work Astronomer. Sunscreen use. Safety issues such as seatbelt and bike helmet use. Physical exam Your health care provider will check your: Height and weight. These may be used to calculate your BMI (body mass index). BMI is a measurement that tells if you are at a healthy weight. Waist circumference. This measures the distance around your waistline. This measurement also tells if you are at a healthy weight and may help predict your risk of certain diseases, such as type 2 diabetes and high blood pressure. Heart rate and blood pressure. Body temperature. Skin for abnormal spots. What immunizations do I need?  Vaccines are usually given at various ages, according to a schedule. Your health care provider will recommend vaccines for you based on your age, medical history, and lifestyle or other factors, such as travel or where you work. What tests do I need? Screening Your health care provider may recommend screening tests for certain conditions. This may include: Lipid and cholesterol levels. Diabetes screening. This is done by checking your blood sugar (glucose) after you have not eaten for a while (fasting). Hepatitis C test. Hepatitis B test. HIV (human  immunodeficiency virus) test. STI (sexually transmitted infection) testing, if you are at risk. Lung cancer screening. Colorectal cancer screening. Prostate cancer screening. Abdominal aortic aneurysm (AAA) screening. You may need this if you are a current or former smoker. Talk with your health care provider about your test results, treatment options, and if necessary, the need for more tests. Follow these instructions at home: Eating and drinking  Eat a diet that includes fresh fruits and vegetables, whole grains, lean protein, and low-fat dairy products. Limit your intake of foods with high amounts of sugar, saturated fats, and salt. Take vitamin and mineral supplements as recommended by your health care provider. Do not drink alcohol if your health care provider tells you not to drink. If you drink alcohol: Limit how much you have to 0-2 drinks a day. Know how much alcohol is in your drink. In the [place] one drink equals one 12 oz bottle of beer (355 mL), one 5 oz glass of wine (148 mL), or one 1 oz glass of hard liquor (44 mL). Lifestyle Brush your teeth every morning and night with fluoride toothpaste. Floss one time each day. Exercise for at least 30 minutes 5 or more days each week. Do not use any products that contain nicotine or tobacco. These products include cigarettes, chewing tobacco, and vaping devices, such as e-cigarettes. If you need help quitting, ask your health care provider. Do not use drugs. If you are sexually active, practice safe sex. Use a condom or other form of protection to prevent STIs. Take aspirin only as told by your health care provider. Make sure that you understand how much to take and what form to take. Work with your health care provider to find out whether it is safe  and beneficial for you to take aspirin daily. Ask your health care provider if you need to take a cholesterol-lowering medicine (statin). Find healthy ways to manage stress, such  as: Meditation, yoga, or listening to music. Journaling. Talking to a trusted person. Spending time with friends and family. Safety Always wear your seat belt while driving or riding in a vehicle. Do not drive: If you have been drinking alcohol. Do not ride with someone who has been drinking. When you are tired or distracted. While texting. If you have been using any mind-altering substances or drugs. Wear a helmet and other protective equipment during sports activities. If you have firearms in your house, make sure you follow all gun safety procedures. Minimize exposure to UV radiation to reduce your risk of skin cancer. What's next? Visit your health care provider once a year for an annual wellness visit. Ask your health care provider how often you should have your eyes and teeth checked. Stay up to date on all vaccines. This information is not intended to replace advice given to you by your health care provider. Make sure you discuss any questions you have with your health care provider. Document Revised: 08/04/2020 Document Reviewed: 08/04/2020 Elsevier Patient Education  2024 [employer].

## 2024-03-14 NOTE — Assessment & Plan Note (Signed)
 He is seeing neurosurgery.  Stable at this time.  Plan to follow up in 1 year.

## 2024-03-15 LAB — CBC WITH DIFFERENTIAL/PLATELET
Basophils Absolute: 0 10*3/uL (ref 0.0–0.2)
Basos: 1 %
EOS (ABSOLUTE): 0.2 10*3/uL (ref 0.0–0.4)
Eos: 4 %
Hematocrit: 44.6 % (ref 37.5–51.0)
Hemoglobin: 14.8 g/dL (ref 13.0–17.7)
Immature Grans (Abs): 0 10*3/uL (ref 0.0–0.1)
Immature Granulocytes: 0 %
Lymphocytes Absolute: 1.4 10*3/uL (ref 0.7–3.1)
Lymphs: 30 %
MCH: 30.6 pg (ref 26.6–33.0)
MCHC: 33.2 g/dL (ref 31.5–35.7)
MCV: 92 fL (ref 79–97)
Monocytes Absolute: 0.5 10*3/uL (ref 0.1–0.9)
Monocytes: 11 %
Neutrophils Absolute: 2.7 10*3/uL (ref 1.4–7.0)
Neutrophils: 54 %
Platelets: 224 10*3/uL (ref 150–450)
RBC: 4.83 x10E6/uL (ref 4.14–5.80)
RDW: 12.6 % (ref 11.6–15.4)
WBC: 4.9 10*3/uL (ref 3.4–10.8)

## 2024-03-15 LAB — CMP14+EGFR
ALT: 29 [IU]/L (ref 0–44)
AST: 24 [IU]/L (ref 0–40)
Albumin: 4.9 g/dL (ref 3.9–4.9)
Alkaline Phosphatase: 51 [IU]/L (ref 47–123)
BUN/Creatinine Ratio: 14 (ref 10–24)
BUN: 14 mg/dL (ref 8–27)
Bilirubin Total: 0.8 mg/dL (ref 0.0–1.2)
CO2: 24 mmol/L (ref 20–29)
Calcium: 9.9 mg/dL (ref 8.6–10.2)
Chloride: 103 mmol/L (ref 96–106)
Creatinine, Ser: 0.97 mg/dL (ref 0.76–1.27)
Globulin, Total: 2.3 g/dL (ref 1.5–4.5)
Glucose: 91 mg/dL (ref 70–99)
Potassium: 4.9 mmol/L (ref 3.5–5.2)
Sodium: 141 mmol/L (ref 134–144)
Total Protein: 7.2 g/dL (ref 6.0–8.5)
eGFR: 85 mL/min/{1.73_m2}

## 2024-03-15 LAB — LIPID PANEL WITH LDL/HDL RATIO
Cholesterol, Total: 191 mg/dL (ref 100–199)
HDL: 61 mg/dL
LDL Chol Calc (NIH): 114 mg/dL — ABNORMAL HIGH (ref 0–99)
LDL/HDL Ratio: 1.9 ratio (ref 0.0–3.6)
Triglycerides: 90 mg/dL (ref 0–149)
VLDL Cholesterol Cal: 16 mg/dL (ref 5–40)

## 2024-03-15 LAB — VITAMIN D 25 HYDROXY (VIT D DEFICIENCY, FRACTURES): Vit D, 25-Hydroxy: 64.2 ng/mL (ref 30.0–100.0)

## 2024-03-21 ENCOUNTER — Ambulatory Visit: Payer: Self-pay | Admitting: Family Medicine

## 2024-08-14 ENCOUNTER — Ambulatory Visit

## 2024-09-11 ENCOUNTER — Ambulatory Visit: Admitting: Family Medicine

## 2024-10-06 ENCOUNTER — Ambulatory Visit: Admitting: Family Medicine
